# Patient Record
Sex: Female | Born: 1990 | Race: White | Hispanic: No | Marital: Single | Smoking: Never smoker
Health system: Southern US, Community
[De-identification: ages and names within clinical notes are randomized; demographics above are authoritative.]

## PROBLEM LIST (undated history)

## (undated) DIAGNOSIS — R7303 Prediabetes: Secondary | ICD-10-CM

## (undated) DIAGNOSIS — F99 Mental disorder, not otherwise specified: Secondary | ICD-10-CM

## (undated) HISTORY — DX: Mental disorder, not otherwise specified: F99

---
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
## 2012-07-13 ENCOUNTER — Emergency Department: Payer: Self-pay | Admitting: Emergency Medicine

## 2013-10-06 ENCOUNTER — Emergency Department: Payer: Self-pay | Admitting: Emergency Medicine

## 2017-10-07 ENCOUNTER — Other Ambulatory Visit: Payer: Self-pay

## 2017-10-07 ENCOUNTER — Encounter: Payer: Self-pay | Admitting: Emergency Medicine

## 2017-10-07 ENCOUNTER — Emergency Department: Payer: Self-pay

## 2017-10-07 ENCOUNTER — Emergency Department
Admission: EM | Admit: 2017-10-07 | Discharge: 2017-10-08 | Disposition: A | Discharge status: Home / Self Care | Payer: Self-pay | Attending: Emergency Medicine | Admitting: Emergency Medicine

## 2017-10-07 DIAGNOSIS — G8929 Other chronic pain: Secondary | ICD-10-CM | POA: Insufficient documentation

## 2017-10-07 DIAGNOSIS — M546 Pain in thoracic spine: Secondary | ICD-10-CM | POA: Insufficient documentation

## 2017-10-07 DIAGNOSIS — R112 Nausea with vomiting, unspecified: Secondary | ICD-10-CM | POA: Insufficient documentation

## 2017-10-07 DIAGNOSIS — F1210 Cannabis abuse, uncomplicated: Secondary | ICD-10-CM | POA: Insufficient documentation

## 2017-10-07 HISTORY — DX: Prediabetes: R73.03

## 2017-10-07 LAB — URINALYSIS, COMPLETE (UACMP) WITH MICROSCOPIC
Bacteria, UA: NONE SEEN
Bilirubin Urine: NEGATIVE
GLUCOSE, UA: NEGATIVE mg/dL
Hgb urine dipstick: NEGATIVE
Ketones, ur: 20 mg/dL — AB
Nitrite: NEGATIVE
PH: 8 (ref 5.0–8.0)
Protein, ur: 100 mg/dL — AB
SPECIFIC GRAVITY, URINE: 1.028 (ref 1.005–1.030)

## 2017-10-07 LAB — POCT PREGNANCY, URINE: PREG TEST UR: NEGATIVE

## 2017-10-07 MED ORDER — ONDANSETRON HCL 4 MG/2ML IJ SOLN
4.0000 mg | Freq: Once | INTRAMUSCULAR | Status: AC
  Administered 2017-10-07: 4 mg via INTRAVENOUS
  Filled 2017-10-07: qty 2

## 2017-10-07 MED ORDER — GI COCKTAIL ~~LOC~~
30.0000 mL | Freq: Once | ORAL | Status: AC
  Administered 2017-10-07: 30 mL via ORAL
  Filled 2017-10-07: qty 30

## 2017-10-07 MED ORDER — KETOROLAC TROMETHAMINE 30 MG/ML IJ SOLN
30.0000 mg | Freq: Once | INTRAMUSCULAR | Status: AC
  Administered 2017-10-07: 30 mg via INTRAVENOUS
  Filled 2017-10-07: qty 1

## 2017-10-07 MED ORDER — HALOPERIDOL LACTATE 5 MG/ML IJ SOLN
5.0000 mg | Freq: Once | INTRAMUSCULAR | Status: AC
  Administered 2017-10-07: 5 mg via INTRAVENOUS
  Filled 2017-10-07: qty 1

## 2017-10-07 NOTE — ED Notes (Signed)
Report given to Greg RN.

## 2017-10-07 NOTE — Discharge Instructions (Signed)
Please seek medical attention for any high fevers, chest pain, shortness of breath, change in behavior, persistent vomiting, bloody stool or any other new or concerning symptoms.  

## 2017-10-07 NOTE — ED Provider Notes (Signed)
[facility] Emergency Department Provider Note    ____________________________________________   I have reviewed the triage vital signs and the nursing notes.   HISTORY  Chief Complaint Back Pain   History limited by: Not Limited   HPI Kristina Avery is a 27 y.o. female who presents to the emergency department today because of concerns for acute on chronic back pain.  Is located primarily across her upper back.  Patient states she has been dealing with this for years.  She has been evaluated multiple emergency department states that she went to the spine center one time for.  She states that when the pain comes on she becomes tearful and develops nausea vomiting.  Vomiting makes it worse.    Per medical record review patient has a history of pre-diabetes.  Past Medical History:  Diagnosis Date  . Pre-diabetes     There are no active problems to display for this patient.   History reviewed. No pertinent surgical history.  Prior to Admission medications   Not on File    Allergies Depo-provera [medroxyprogesterone acetate]  No family history on file.  Social History Social History   Tobacco Use  . Smoking status: Never Smoker  . Smokeless tobacco: Never Used  Substance Use Topics  . Alcohol use: Yes    Comment: Occ  . Drug use: Yes    Types: Marijuana    Comment: Daily, only thing that helps with the pain    Review of Systems Constitutional: No fever/chills Eyes: No visual changes. ENT: No sore throat. Cardiovascular: Denies chest pain. Respiratory: Denies shortness of breath. Gastrointestinal: Positive for nausea and vomiting. Genitourinary: Negative for dysuria. Musculoskeletal: Positive for back pain. Skin: Negative for rash. Neurological: Negative for headaches, focal weakness or numbness.  ____________________________________________   PHYSICAL EXAM:  VITAL SIGNS: ED Triage Vitals  Enc Vitals Group     BP 10/07/17  1812 126/73     Pulse Rate 10/07/17 1812 (!) 52     Resp --      Temp 10/07/17 1812 97.7 F (36.5 C)     Temp Source 10/07/17 1812 Oral     SpO2 10/07/17 1812 100 %     Weight 10/07/17 1815 190 lb (86.2 kg)     Height 10/07/17 1815 4\' 8"  (1.422 m)     Head Circumference --      Peak Flow --      Pain Score 10/07/17 1820 10     Pain Loc --      Pain Edu? --      Excl. in GC? --    Constitutional: Alert and oriented.  Eyes: Conjunctivae are normal.  ENT      Head: Normocephalic and atraumatic.      Nose: No congestion/rhinnorhea.      Mouth/Throat: Mucous membranes are moist.      Neck: No stridor. Hematological/Lymphatic/Immunilogical: No cervical lymphadenopathy. Cardiovascular: Normal rate, regular rhythm.  No murmurs, rubs, or gallops.  Respiratory: Normal respiratory effort without tachypnea nor retractions. Breath sounds are clear and equal bilaterally. No wheezes/rales/rhonchi. Gastrointestinal: Soft and non tender. No rebound. No guarding.  Genitourinary: Deferred Musculoskeletal: Normal range of motion in all extremities. No midline spinal tenderness. Neurologic:  Normal speech and language. No gross focal neurologic deficits are appreciated.  Skin:  Skin is warm, dry and intact. No rash noted. Psychiatric: Mood and affect are normal. Speech and behavior are normal. Patient exhibits appropriate insight and judgment.  ____________________________________________    LABS (  pertinent positives/negatives)  Upreg negative UA cloudy, 20 ketones, 100 protein, trace leukocytes 6-10 rbc and wbc, 21-50 squamous  ____________________________________________   EKG  I, Phineas Semen, attending physician, personally viewed and interpreted this EKG  EKG Time: 1828 Rate: 57 Rhythm: sinus bradycardia Axis: normal Intervals: qtc 418 QRS: narrow ST changes: no st elevation Impression: abnormal ekg  ____________________________________________    RADIOLOGY  Thoracic  spine No acute finding  ____________________________________________   PROCEDURES  Procedures  ____________________________________________   INITIAL IMPRESSION / ASSESSMENT AND PLAN / ED COURSE  Pertinent labs & imaging results that were available during my care of the patient were reviewed by me and considered in my medical decision making (see chart for details).   Patient presented to the emergency department today with acute on chronic back pain.  She has seen orthopedic surgery for this.  Patient had x-rays performed here which did not show any concerning findings.  UA did show some signs of infection however also multiple squamous epithelial.  I discussed this with the patient.  Did discuss that we would send a urine culture.  At this point will defer antibiotics.  Did discuss with patient importance of continued follow-up with orthopedics.   ____________________________________________   FINAL CLINICAL IMPRESSION(S) / ED DIAGNOSES  Final diagnoses:  Chronic thoracic back pain, unspecified back pain laterality     Note: This dictation was prepared with Dragon dictation. Any transcriptional errors that result from this process are unintentional     Phineas Semen, MD 10/07/17 2302

## 2017-10-07 NOTE — ED Notes (Signed)
Patient transported to X-ray 

## 2017-10-07 NOTE — ED Triage Notes (Addendum)
Pt presents to ED via POV with c/o back pain. Pt states pain intermittently since high school. Pt upper back pain at this time. Pt denies heavy lifting at work. Pt states pain started on Friday and ibuprofen and icy hot lotion is no longer helping. Pt is tearful on assessment. Pt states pain is so bad that she has vomited and is unable to keep anything down.

## 2017-10-08 ENCOUNTER — Emergency Department
Admission: EM | Admit: 2017-10-08 | Discharge: 2017-10-08 | Disposition: A | Discharge status: Home / Self Care | Payer: Self-pay | Attending: Emergency Medicine | Admitting: Emergency Medicine

## 2017-10-08 ENCOUNTER — Encounter: Payer: Self-pay | Admitting: Emergency Medicine

## 2017-10-08 DIAGNOSIS — T434X1A Poisoning by butyrophenone and thiothixene neuroleptics, accidental (unintentional), initial encounter: Secondary | ICD-10-CM | POA: Insufficient documentation

## 2017-10-08 DIAGNOSIS — G2571 Drug induced akathisia: Secondary | ICD-10-CM | POA: Insufficient documentation

## 2017-10-08 MED ORDER — DIPHENHYDRAMINE HCL 25 MG PO CAPS
50.0000 mg | ORAL_CAPSULE | Freq: Once | ORAL | Status: AC
  Administered 2017-10-08: 50 mg via ORAL
  Filled 2017-10-08: qty 2

## 2017-10-08 MED ORDER — LIDOCAINE 5 % EX PTCH: 1.0000 | MEDICATED_PATCH | Freq: Two times a day (BID) | CUTANEOUS | 0 refills | Status: AC

## 2017-10-08 MED ORDER — DIPHENHYDRAMINE HCL 50 MG/ML IJ SOLN
50.0000 mg | Freq: Once | INTRAMUSCULAR | Status: AC
  Administered 2017-10-08: 50 mg via INTRAVENOUS
  Filled 2017-10-08: qty 1

## 2017-10-08 NOTE — Discharge Instructions (Signed)
Please take 50mg  of benadryl at 9pm tonight as needed.    Never take HALDOL, REGLAN, OR COMPAZINE again.  It was a pleasure to take care of you today, and thank you for coming to our emergency department.  If you have any questions or concerns before leaving please ask the nurse to grab me and I'm more than happy to go through your aftercare instructions again.  If you were prescribed any opioid pain medication today such as Norco, Vicodin, Percocet, morphine, hydrocodone, or oxycodone please make sure you do not drive when you are taking this medication as it can alter your ability to drive safely.  If you have any concerns once you are home that you are not improving or are in fact getting worse before you can make it to your follow-up appointment, please do not hesitate to call 911 and come back for further evaluation.  Merrily Brittle, MD  Results for orders placed or performed during the hospital encounter of 10/07/17  Urinalysis, Complete w Microscopic  Result Value Ref Range   Color, Urine AMBER (A) YELLOW   APPearance CLOUDY (A) CLEAR   Specific Gravity, Urine 1.028 1.005 - 1.030   pH 8.0 5.0 - 8.0   Glucose, UA NEGATIVE NEGATIVE mg/dL   Hgb urine dipstick NEGATIVE NEGATIVE   Bilirubin Urine NEGATIVE NEGATIVE   Ketones, ur 20 (A) NEGATIVE mg/dL   Protein, ur 416 (A) NEGATIVE mg/dL   Nitrite NEGATIVE NEGATIVE   Leukocytes, UA TRACE (A) NEGATIVE   RBC / HPF 6-10 0 - 5 RBC/hpf   WBC, UA 6-10 0 - 5 WBC/hpf   Bacteria, UA NONE SEEN NONE SEEN   Squamous Epithelial / LPF 21-50 0 - 5   Mucus PRESENT   Pregnancy, urine POC  Result Value Ref Range   Preg Test, Ur NEGATIVE NEGATIVE   Dg Thoracic Spine 2 View  Result Date: 10/07/2017 CLINICAL DATA:  Back pain EXAM: THORACIC SPINE 2 VIEWS COMPARISON:  None. FINDINGS: There is no evidence of thoracic spine fracture. Alignment is normal. No other significant bone abnormalities are identified. IMPRESSION: Negative. Electronically Signed    By: Jasmine Pang M.D.   On: 10/07/2017 22:47

## 2017-10-08 NOTE — ED Provider Notes (Signed)
[facility] Emergency Department Provider Note  ____________________________________________   First MD Initiated Contact with Patient 10/08/17 1423     (approximate)  I have reviewed the triage vital signs and the nursing notes.   HISTORY  Chief Complaint Anxiety and Medication Reaction   HPI Kristina Avery is a 27 y.o. female comes to the emergency department with anxiety, a sense of unease, and fidgeting that began yesterday shortly after being administered haloperidol in our emergency department.  She was seen last night for chronic back pain and administered Haldol which did help her back pain but nearly immediately gave her a sense of unease.  She went home and was unable to sleep feeling anxious and fidgeting so she came to the emergency department.  She is never had this before.  Symptoms came on gradually are now severe and constant.  Nothing seems to make them better and clearly Haldol made them worse.    Past Medical History:  Diagnosis Date  . Pre-diabetes     There are no active problems to display for this patient.   History reviewed. No pertinent surgical history.  Prior to Admission medications   Medication Sig Start Date End Date Taking? Authorizing Provider  acetaminophen (TYLENOL) 500 MG tablet Take 500-1,000 mg by mouth every 6 (six) hours as needed for mild pain or moderate pain.    [provider]  lidocaine (LIDODERM) 5 % Place 1 patch onto the skin every 12 (twelve) hours. Remove & Discard patch within 12 hours or as directed by MD.  Wynelle Fanny the patch off for 12 hours before applying a new one. 10/08/17 10/08/18  Loleta Rose, MD    Allergies Depo-provera [medroxyprogesterone acetate]  No family history on file.  Social History Social History   Tobacco Use  . Smoking status: Never Smoker  . Smokeless tobacco: Never Used  Substance Use Topics  . Alcohol use: Yes    Comment: Occ  . Drug use: Yes    Types:  Marijuana    Comment: Daily, only thing that helps with the pain    Review of Systems Constitutional: No fever/chills Eyes: No visual changes. ENT: No sore throat. Cardiovascular: Denies chest pain. Respiratory: Denies shortness of breath. Gastrointestinal: No abdominal pain.  No nausea, no vomiting.  No diarrhea.  No constipation. Genitourinary: Negative for dysuria. Musculoskeletal: Negative for back pain. Skin: Negative for rash. Neurological: Negative for headaches, focal weakness or numbness.   ____________________________________________   PHYSICAL EXAM:  VITAL SIGNS: ED Triage Vitals  Enc Vitals Group     BP 10/08/17 1415 (!) 182/148     Pulse Rate 10/08/17 1415 78     Resp 10/08/17 1415 (!) 28     Temp 10/08/17 1415 98.4 F (36.9 C)     Temp Source 10/08/17 1415 Oral     SpO2 10/08/17 1415 99 %     Weight 10/08/17 1418 190 lb (86.2 kg)     Height 10/08/17 1418 4\' 8"  (1.422 m)     Head Circumference --      Peak Flow --      Pain Score 10/08/17 1418 4     Pain Loc --      Pain Edu? --      Excl. in GC? --     Constitutional: Alert and oriented x4 sitting up in bed fidgeting wringing her hands appears extremely anxious Eyes: PERRL EOMI. Head: Atraumatic. Nose: No congestion/rhinnorhea. Mouth/Throat: No trismus Neck: No stridor.  Cardiovascular: Normal rate, regular rhythm. Grossly normal heart sounds.  Good peripheral circulation. Respiratory: Normal respiratory effort.  No retractions. Lungs CTAB and moving good air Gastrointestinal: Soft nontender Musculoskeletal: No lower extremity edema   Neurologic:  Normal speech and language. No gross focal neurologic deficits are appreciated. Skin:  Skin is warm, dry and intact. No rash noted. Psychiatric: Very anxious appearing   ____________________________________________   DIFFERENTIAL includes but not limited to  Akinesthesia, EPS, anxiety ____________________________________________   LABS (all  labs ordered are listed, but only abnormal results are displayed)  Labs Reviewed - No data to display   __________________________________________  EKG   ____________________________________________  RADIOLOGY   ____________________________________________   PROCEDURES  Procedure(s) performed: Yes  Angiocath insertion Performed by: Merrily Brittle  Consent: Verbal consent obtained. Risks and benefits: risks, benefits and alternatives were discussed Time out: Immediately prior to procedure a "time out" was called to verify the correct patient, procedure, equipment, support staff and site/side marked as required.  Preparation: Patient was prepped and draped in the usual sterile fashion.  Vein Location: Right upper extremity  Gauge: 22  Normal blood return and flush without difficulty Patient tolerance: Patient tolerated the procedure well with no immediate complications.     Procedures  Critical Care performed: no  ____________________________________________   INITIAL IMPRESSION / ASSESSMENT AND PLAN / ED COURSE  Pertinent labs & imaging results that were available during my care of the patient were reviewed by me and considered in my medical decision making (see chart for details).   As part of my medical decision making, I reviewed the following data within the electronic MEDICAL RECORD NUMBER History obtained from family if available, nursing notes, old chart and ekg, as well as notes from prior ED visits.  The patient arrives with symptoms consistent with drug-induced akinesthesia likely from dopamine blockade from haloperidol.  Given 50 mg of oral Benadryl prior to coming into the room and then we administered 50 mg IV more with near complete resolution of her symptoms.  Given reassurance and have advised her never to have haloperidol, Reglan, or Compazine in the future.  Discharged home in improved condition.       ____________________________________________   FINAL CLINICAL IMPRESSION(S) / ED DIAGNOSES  Final diagnoses:  Drug induced akathisia      NEW MEDICATIONS STARTED DURING THIS VISIT:  Discharge Medication List as of 10/08/2017  3:44 PM       Note:  This document was prepared using Dragon voice recognition software and may include unintentional dictation errors.     Merrily Brittle, MD 10/10/17 [id number]

## 2017-10-08 NOTE — ED Triage Notes (Signed)
Patient presents to the ED with anxiety and hyperventilating.  Patient's hands are shaking, patient is very fidgety.  Patient states she is feeling very anxious ever since she was given haldol in the ED yesterday evening for back pain.  Patient states at first the medication helped her pain and helped her to rest, but afterward she has been very anxious and feeling very hot.

## 2017-10-09 LAB — URINE CULTURE

## 2018-02-02 ENCOUNTER — Other Ambulatory Visit: Payer: Self-pay

## 2018-02-02 ENCOUNTER — Encounter: Payer: Self-pay | Admitting: Emergency Medicine

## 2018-02-02 ENCOUNTER — Emergency Department: Payer: Medicaid Other

## 2018-02-02 ENCOUNTER — Emergency Department
Admission: EM | Admit: 2018-02-02 | Discharge: 2018-02-02 | Disposition: A | Discharge status: Home / Self Care | Payer: Medicaid Other | Attending: Emergency Medicine | Admitting: Emergency Medicine

## 2018-02-02 DIAGNOSIS — O209 Hemorrhage in early pregnancy, unspecified: Secondary | ICD-10-CM | POA: Insufficient documentation

## 2018-02-02 DIAGNOSIS — Z79899 Other long term (current) drug therapy: Secondary | ICD-10-CM | POA: Insufficient documentation

## 2018-02-02 DIAGNOSIS — O4690 Antepartum hemorrhage, unspecified, unspecified trimester: Secondary | ICD-10-CM

## 2018-02-02 DIAGNOSIS — O26859 Spotting complicating pregnancy, unspecified trimester: Secondary | ICD-10-CM | POA: Diagnosis present

## 2018-02-02 LAB — HCG, QUANTITATIVE, PREGNANCY: hCG, Beta Chain, Quant, S: 587 m[IU]/mL — ABNORMAL HIGH (ref <5)

## 2018-02-02 LAB — ABO/RH: ABO/RH(D): O POS

## 2018-02-02 NOTE — ED Notes (Signed)
Pt verbalizes d/c understanding and follow up. Pt in NAD, VSS. Pt unable to sign due to signature pad malfnx

## 2018-02-02 NOTE — ED Triage Notes (Signed)
Pt to ED via POV c/o vaginal bleeding. Pt states that she had a positive pregnancy test on 12/25. Pt was at work and went to the bathroom and noticed that she was bleeding. Pt states that it is heavier than spotting but not period flow. Pt states that she is having mild abdominal cramps. Pt is in NAD at this time.

## 2018-02-02 NOTE — ED Provider Notes (Signed)
[facility] Emergency Department Provider Note   ____________________________________________    I have reviewed the triage vital signs and the nursing notes.   HISTORY  Chief Complaint Vaginal Bleeding     HPI Kristina Avery is a 27 y.o. female who recently discovered that she was pregnant who presents today after having episode of vaginal bleeding.  Patient reports he went to urinate and noticed small mount of blood slightly more than spotting.  This is improved and now she is no longer having bleeding.  She denies abdominal cramping or pain.  No fevers or chills.  G2 P0.  Past Medical History:  Diagnosis Date  . Pre-diabetes     There are no active problems to display for this patient.   History reviewed. No pertinent surgical history.  Prior to Admission medications   Medication Sig Start Date End Date Taking? Authorizing Provider  acetaminophen (TYLENOL) 500 MG tablet Take 500-1,000 mg by mouth every 6 (six) hours as needed for mild pain or moderate pain.    [provider]  lidocaine (LIDODERM) 5 % Place 1 patch onto the skin every 12 (twelve) hours. Remove & Discard patch within 12 hours or as directed by MD.  Wynelle FannyLeave the patch off for 12 hours before applying a new one. 10/08/17 10/08/18  Loleta RoseForbach, Cory, MD     Allergies Haldol [haloperidol] and Depo-provera [medroxyprogesterone acetate]  No family history on file.  Social History Social History   Tobacco Use  . Smoking status: Never Smoker  . Smokeless tobacco: Never Used  Substance Use Topics  . Alcohol use: Yes    Comment: Occ  . Drug use: Yes    Types: Marijuana    Comment: Daily, only thing that helps with the pain    Review of Systems  Constitutional: No fever/chills   Cardiovascular: Denies chest pain. Respiratory: Denies shortness of breath. Gastrointestinal: As above Genitourinary: Negative for dysuria.  Bleeding as above Musculoskeletal: Negative for  back pain. Skin: Negative for rash. Neurological: Negative for headaches    ____________________________________________   PHYSICAL EXAM:  VITAL SIGNS: ED Triage Vitals  Enc Vitals Group     BP 02/02/18 1601 119/84     Pulse Rate 02/02/18 1601 83     Resp 02/02/18 1601 16     Temp 02/02/18 1601 98.8 F (37.1 C)     Temp Source 02/02/18 1601 Oral     SpO2 02/02/18 1601 99 %     Weight 02/02/18 1601 81.6 kg (180 lb)     Height 02/02/18 1601 1.422 m (4\' 8" )     Head Circumference --      Peak Flow --      Pain Score 02/02/18 1604 2     Pain Loc --      Pain Edu? --      Excl. in GC? --     Constitutional: Alert and oriented.  Eyes: Conjunctivae are normal.   Nose: No congestion/rhinnorhea. Mouth/Throat: Mucous membranes are moist.    Cardiovascular: Normal rate, regular rhythm.   Good peripheral circulation. Respiratory: Normal respiratory effort.  No retractions.  Gastrointestinal: Soft and nontender. No distention.    Musculoskeletal:  Warm and well perfused Neurologic:  Normal speech and language. No gross focal neurologic deficits are appreciated.  Skin:  Skin is warm, dry and intact. No rash noted. Psychiatric: Mood and affect are normal. Speech and behavior are normal.  ____________________________________________   LABS (all labs ordered are listed, but  only abnormal results are displayed)  Labs Reviewed  HCG, QUANTITATIVE, PREGNANCY - Abnormal; Notable for the following components:      Result Value   hCG, Beta Chain, Quant, S 587 (*)    All other components within normal limits  ABO/RH   ____________________________________________  EKG  None ____________________________________________  RADIOLOGY Ultrasound pending ____________________________________________   PROCEDURES  Procedure(s) performed: No  Procedures   Critical Care performed: No ____________________________________________   INITIAL IMPRESSION / ASSESSMENT AND PLAN /  ED COURSE  Pertinent labs & imaging results that were available during my care of the patient were reviewed by me and considered in my medical decision making (see chart for details).  Patient reports that she thinks she is approximately 6 to [redacted] weeks pregnant, vaginal bleeding today now resolved.  Pending ultrasound.  Patient is Rh+  Ultrasound does not demonstrate any visible gestational sac, this may be because she is too early.  Discussed with her that she needs a repeat evaluation/ultrasound.  She has no abdominal pain or cramping    ____________________________________________   FINAL CLINICAL IMPRESSION(S) / ED DIAGNOSES  Final diagnoses:  Vaginal bleeding in pregnancy        Note:  This document was prepared using Dragon voice recognition software and may include unintentional dictation errors.    Jene EveryKinner, Amari Zagal, MD 02/02/18 2122

## 2018-02-04 ENCOUNTER — Encounter: Payer: Self-pay | Admitting: Emergency Medicine

## 2018-02-04 ENCOUNTER — Emergency Department: Payer: Medicaid Other

## 2018-02-04 ENCOUNTER — Other Ambulatory Visit: Payer: Self-pay

## 2018-02-04 ENCOUNTER — Emergency Department
Admission: EM | Admit: 2018-02-04 | Discharge: 2018-02-04 | Disposition: A | Discharge status: Home / Self Care | Payer: Medicaid Other | Attending: Emergency Medicine | Admitting: Emergency Medicine

## 2018-02-04 DIAGNOSIS — Z3A00 Weeks of gestation of pregnancy not specified: Secondary | ICD-10-CM | POA: Diagnosis not present

## 2018-02-04 DIAGNOSIS — O209 Hemorrhage in early pregnancy, unspecified: Secondary | ICD-10-CM | POA: Diagnosis present

## 2018-02-04 DIAGNOSIS — O4690 Antepartum hemorrhage, unspecified, unspecified trimester: Secondary | ICD-10-CM

## 2018-02-04 DIAGNOSIS — O200 Threatened abortion: Secondary | ICD-10-CM | POA: Diagnosis not present

## 2018-02-04 LAB — CBC
HCT: 39.3 % (ref 36.0–46.0)
Hemoglobin: 12.8 g/dL (ref 12.0–15.0)
MCH: 27.9 pg (ref 26.0–34.0)
MCHC: 32.6 g/dL (ref 30.0–36.0)
MCV: 85.6 fL (ref 80.0–100.0)
NRBC: 0 % (ref 0.0–0.2)
Platelets: 267 10*3/uL (ref 150–400)
RBC: 4.59 MIL/uL (ref 3.87–5.11)
RDW: 14.9 % (ref 11.5–15.5)
WBC: 6 10*3/uL (ref 4.0–10.5)

## 2018-02-04 LAB — TYPE AND SCREEN
ABO/RH(D): O POS
ANTIBODY SCREEN: NEGATIVE

## 2018-02-04 LAB — I-STAT BETA HCG BLOOD, ED (NOT ORDERABLE): I-stat hCG, quantitative: 895.6 m[IU]/mL — ABNORMAL HIGH (ref <5)

## 2018-02-04 NOTE — ED Triage Notes (Signed)
Pt seen here 2 days ago. ABO done then. C/o increased vaginal bleeding and cramping to lower abdomen.  Ambulatory with steady gait. NAD. Unlabored.  Will repeat labs and US while waiting for room.

## 2018-02-04 NOTE — ED Provider Notes (Signed)
[facility] Emergency Department Provider Note   ____________________________________________    I have reviewed the triage vital signs and the nursing notes.   HISTORY  Chief Complaint Vaginal Bleeding     HPI Kristina Avery is a 27 y.o. female who presents with complaints of lower abdominal cramping and vaginal bleeding, slightly worse than yesterday.  She became anxious regarding this and came for reevaluation.  Patient seen by me yesterday with ultrasound and beta as noted in the medical record.  Currently she is feeling better and does not have any significant pelvic cramping, she reports bleeding has improved.   Past Medical History:  Diagnosis Date  . Pre-diabetes     There are no active problems to display for this patient.   History reviewed. No pertinent surgical history.  Prior to Admission medications   Medication Sig Start Date End Date Taking? Authorizing Provider  acetaminophen (TYLENOL) 500 MG tablet Take 500-1,000 mg by mouth every 6 (six) hours as needed for mild pain or moderate pain.    [provider]  lidocaine (LIDODERM) 5 % Place 1 patch onto the skin every 12 (twelve) hours. Remove & Discard patch within 12 hours or as directed by MD.  Wynelle FannyLeave the patch off for 12 hours before applying a new one. 10/08/17 10/08/18  Loleta RoseForbach, Cory, MD     Allergies Haldol [haloperidol] and Depo-provera [medroxyprogesterone acetate]  History reviewed. No pertinent family history.  Social History Social History   Tobacco Use  . Smoking status: Never Smoker  . Smokeless tobacco: Never Used  Substance Use Topics  . Alcohol use: Yes    Comment: Occ  . Drug use: Yes    Types: Marijuana    Comment: Daily, only thing that helps with the pain    Review of Systems  Constitutional: No fever Eyes: No visual changes.  ENT: No neck pain Cardiovascular: Denies chest pain. Respiratory: Denies cough Gastrointestinal: No abdominal  pain.  No nausea, no vomiting.   Genitourinary: as above Musculoskeletal: Negative for back pain. Skin: Negative for rash. Neurological: Negative for headaches    ____________________________________________   PHYSICAL EXAM:  VITAL SIGNS: ED Triage Vitals  Enc Vitals Group     BP 02/04/18 1216 100/71     Pulse Rate 02/04/18 1216 71     Resp 02/04/18 1216 14     Temp 02/04/18 1216 98.4 F (36.9 C)     Temp Source 02/04/18 1216 Oral     SpO2 02/04/18 1216 99 %     Weight 02/04/18 1131 81.6 kg (179 lb 14.3 oz)     Height 02/04/18 1131 1.422 m (4\' 8" )     Head Circumference --      Peak Flow --      Pain Score 02/04/18 1131 4     Pain Loc --      Pain Edu? --      Excl. in GC? --     Constitutional: Alert and oriented. anxiou  Nose: No congestion/rhinnorhea. Mouth/Throat: Mucous membranes are moist.    Cardiovascular: Normal rate, regular rhythm. Peri [place].  Good peripheral circulation. Respiratory: Normal respiratory effort.  No retractions. Gastrointestinal: Soft and nontender. No distention.  No CVA tenderness.  Neurologic:  Normal speech and language. No gross focal neurologic deficits are appreciated.  Skin:  Skin is warm, dry and intact. No rash noted. Psychiatric: Mood and affect are normal. Speech and behavior are normal.  ____________________________________________   LABS (all labs ordered are  listed, but only abnormal results are displayed)  Labs Reviewed  I-STAT BETA HCG BLOOD, ED (NOT ORDERABLE) - Abnormal; Notable for the following components:      Result Value   I-stat hCG, quantitative 895.6 (*)    All other components within normal limits  CBC  TYPE AND SCREEN   ____________________________________________  EKG   ____________________________________________  RADIOLOGY  Ultrasound unchanged from yesterday ____________________________________________   PROCEDURES  Procedure(s) performed: No  Procedures   Critical Care performed:  No ____________________________________________   INITIAL IMPRESSION / ASSESSMENT AND PLAN / ED COURSE  Pertinent labs & imaging results that were available during my care of the patient were reviewed by me and considered in my medical decision making (see chart for details).  Informed patient that beta-hCG has increased which is a positive sign but miscarriage is still a possibility, she needs follow-up with GYN.  Patient is O+    ____________________________________________   FINAL CLINICAL IMPRESSION(S) / ED DIAGNOSES  Final diagnoses:  Threatened miscarriage  Vaginal bleeding in pregnancy        Note:  This document was prepared using Dragon voice recognition software and may include unintentional dictation errors.   Jene EveryKinner, Lenin Kuhnle, MD 02/04/18 2132

## 2018-02-14 ENCOUNTER — Other Ambulatory Visit (HOSPITAL_COMMUNITY)
Admission: RE | Admit: 2018-02-14 | Discharge: 2018-02-14 | Disposition: A | Discharge status: Home / Self Care | Payer: Medicaid Other | Source: Ambulatory Visit | Attending: Obstetrics and Gynecology | Admitting: Obstetrics and Gynecology

## 2018-02-14 ENCOUNTER — Encounter: Payer: Self-pay | Admitting: Obstetrics and Gynecology

## 2018-02-14 ENCOUNTER — Ambulatory Visit (INDEPENDENT_AMBULATORY_CARE_PROVIDER_SITE_OTHER): Payer: Medicaid Other | Admitting: Obstetrics and Gynecology

## 2018-02-14 VITALS — BP 104/68 | HR 73 | Ht <= 58 in | Wt 180.0 lb

## 2018-02-14 DIAGNOSIS — O200 Threatened abortion: Secondary | ICD-10-CM

## 2018-02-14 DIAGNOSIS — O3680X0 Pregnancy with inconclusive fetal viability, not applicable or unspecified: Secondary | ICD-10-CM | POA: Diagnosis not present

## 2018-02-14 DIAGNOSIS — Z124 Encounter for screening for malignant neoplasm of cervix: Secondary | ICD-10-CM | POA: Insufficient documentation

## 2018-02-14 NOTE — Progress Notes (Signed)
Patient ID: Kristina Avery, female   DOB: May 13, 1990, 28 y.o.   MRN: [id number]  Reason for Consult: Follow-up (Still having heavy bleeding sense last sunday, small clots passing, light cramping )   Referred by No ref. provider found  Subjective:     HPI:  Kristina Avery is a 28 y.o. female   Positive pregnancy test on Christmas. Does not know her LMP. It may of been November. Does not have regular menstrual periods. Has been trying to concieve for 8 years.  Since the 29 th has been having bleeding, two days of heavier bleeding.   Past Medical History:  Diagnosis Date  . Pre-diabetes    Family History  Problem Relation Age of Onset  . Diabetes Father   . Diabetes Brother   . Diabetes Maternal Grandmother   . COPD Maternal Grandmother   . Diabetes Maternal Grandfather   . Diabetes Paternal Grandmother   . Diabetes Paternal Grandfather   . COPD Paternal Grandfather    History reviewed. No pertinent surgical history.  Short Social History:  Social History   Tobacco Use  . Smoking status: Never Smoker  . Smokeless tobacco: Never Used  Substance Use Topics  . Alcohol use: Yes    Comment: Occ    Allergies  Allergen Reactions  . Haldol [Haloperidol] Other (See Comments)    Akathisia  . Depo-Provera [Medroxyprogesterone Acetate] Other (See Comments)    "got really sick, my eyes turned yellow".    Current Outpatient Medications  Medication Sig Dispense Refill  . acetaminophen (TYLENOL) 500 MG tablet Take 500-1,000 mg by mouth every 6 (six) hours as needed for mild pain or moderate pain.    Marland Kitchen lidocaine (LIDODERM) 5 % Place 1 patch onto the skin every 12 (twelve) hours. Remove & Discard patch within 12 hours or as directed by MD.  Wynelle Fanny the patch off for 12 hours before applying a new one. (Patient not taking: Reported on 02/14/2018) 10 patch 0   No current facility-administered medications for this visit.     Review of Systems  Constitutional: Negative for  chills, fatigue, fever and unexpected weight change.  HENT: Negative for trouble swallowing.  Eyes: Negative for loss of vision.  Respiratory: Negative for cough, shortness of breath and wheezing.  Cardiovascular: Negative for chest pain, leg swelling, palpitations and syncope.  GI: Negative for abdominal pain, blood in stool, diarrhea, nausea and vomiting.  GU: Negative for difficulty urinating, dysuria, frequency and hematuria.  Musculoskeletal: Negative for back pain, leg pain and joint pain.  Skin: Negative for rash.  Neurological: Negative for dizziness, headaches, light-headedness, numbness and seizures.  Psychiatric: Negative for behavioral problem, confusion, depressed mood and sleep disturbance.        Objective:  Objective   Vitals:   02/14/18 1327  BP: 104/68  Pulse: 73  Weight: 180 lb (81.6 kg)  Height: 4\' 8"  (1.422 m)   Body mass index is 40.36 kg/m.  Physical Exam Vitals signs and nursing note reviewed.  Constitutional:      Appearance: She is well-developed.  HENT:     Head: Normocephalic and atraumatic.  Eyes:     Pupils: Pupils are equal, round, and reactive to light.  Cardiovascular:     Rate and Rhythm: Normal rate and regular rhythm.  Pulmonary:     Effort: Pulmonary effort is normal. No respiratory distress.  Genitourinary:    Comments: No active vaginal bleeding on speculum exam Skin:    General: Skin is warm  and dry.  Neurological:     Mental Status: She is alert and oriented to person, place, and time.  Psychiatric:        Behavior: Behavior normal.        Thought Content: Thought content normal.        Judgment: Judgment normal.    Bedside US TVUS showed IUP with yolk sac and gestational sac     Assessment/Plan:    27yo G2P0010 with threatened miscarriage Bedside transvaginal US performed, yolk sac and gestational sac seen. Photo in media file.  Follow up in 1 week Beta hcg today  Bleeding precautions given, follow up if there is  heavier bleeding  More than 45 minutes were spent face to face with the patient in the room with more than 50% of the time spent providing counseling and discussing the plan of management. We discussed.   Adelene Idlerhristanna Schuman MD [facility], [facility] 02/14/2018 2:12 PM

## 2018-02-15 LAB — BETA HCG QUANT (REF LAB): HCG QUANT: 9873 m[IU]/mL

## 2018-02-17 ENCOUNTER — Encounter: Payer: Self-pay | Admitting: Obstetrics & Gynecology

## 2018-02-17 ENCOUNTER — Other Ambulatory Visit: Payer: Self-pay | Admitting: Obstetrics & Gynecology

## 2018-02-17 ENCOUNTER — Ambulatory Visit (INDEPENDENT_AMBULATORY_CARE_PROVIDER_SITE_OTHER): Payer: Medicaid Other | Admitting: Obstetrics & Gynecology

## 2018-02-17 ENCOUNTER — Ambulatory Visit (INDEPENDENT_AMBULATORY_CARE_PROVIDER_SITE_OTHER): Payer: Medicaid Other

## 2018-02-17 VITALS — BP 100/60 | Ht <= 58 in | Wt 183.0 lb

## 2018-02-17 DIAGNOSIS — Z3A01 Less than 8 weeks gestation of pregnancy: Secondary | ICD-10-CM | POA: Diagnosis not present

## 2018-02-17 DIAGNOSIS — O3680X0 Pregnancy with inconclusive fetal viability, not applicable or unspecified: Secondary | ICD-10-CM

## 2018-02-17 DIAGNOSIS — O209 Hemorrhage in early pregnancy, unspecified: Secondary | ICD-10-CM

## 2018-02-17 DIAGNOSIS — O200 Threatened abortion: Secondary | ICD-10-CM | POA: Diagnosis not present

## 2018-02-17 NOTE — Progress Notes (Signed)
Obstetric Problem Visit    Chief Complaint  Patient presents with  . Follow-up  . Vaginal Bleeding   History of Present Illness: Patient is a 28 y.o. G2P0010 Unknown presenting for first trimester bleeding.  The onset of bleeding was for 2 weeks.  No pain.  Still bleeding today. Min nausea.  Mild breast T.  She has seen beta rise from 900 to 9800 US shows gest sac, may be in cornua vs having an abnormal uterus (right sided gest sac).  Flicker of FHTs.  O+  PMHx: She  has a past medical history of Pre-diabetes. Also,  has no past surgical history on file., family history includes COPD in her maternal grandmother and paternal grandfather; Diabetes in her brother, father, maternal grandfather, maternal grandmother, paternal grandfather, and paternal grandmother.,  reports that she has never smoked. She has never used smokeless tobacco. She reports current alcohol use. She reports current drug use. Drug: Marijuana.  She has a current medication list which includes the following prescription(s): acetaminophen and lidocaine. Also, is allergic to haldol [haloperidol] and depo-provera [medroxyprogesterone acetate].  Review of Systems  Constitutional: Negative for chills, fever and malaise/fatigue.  HENT: Negative for congestion, sinus pain and sore throat.   Eyes: Negative for blurred vision and pain.  Respiratory: Negative for cough and wheezing.   Cardiovascular: Negative for chest pain and leg swelling.  Gastrointestinal: Negative for abdominal pain, constipation, diarrhea, heartburn, nausea and vomiting.  Genitourinary: Negative for dysuria, frequency, hematuria and urgency.  Musculoskeletal: Negative for back pain, joint pain, myalgias and neck pain.  Skin: Negative for itching and rash.  Neurological: Negative for dizziness, tremors and weakness.  Endo/Heme/Allergies: Does not bruise/bleed easily.  Psychiatric/Behavioral: Negative for depression. The patient is not nervous/anxious and  does not have insomnia.     Objective: Vitals:   02/17/18 1420  BP: 100/60   Physical Exam Constitutional:      General: She is not in acute distress.    Appearance: She is well-developed.  Musculoskeletal: Normal range of motion.  Neurological:     Mental Status: She is alert and oriented to person, place, and time.  Skin:    General: Skin is warm and dry.  Vitals signs reviewed.   Assessment: 28 y.o. G2P0010 Bleeding, pregnancy location abnormal, 5 weeks Plan:   Visit Diagnoses    Pregnancy, location unknown    -  Primary   Relevant Orders   US OB Transvaginal    Cont to monitor bleeding and for pain - [place] one week - Risks of Cornual pregnancy discussed, vs arcuate uterus, bicornuate uterus, septate uterus       All have impact on fertility, miscarriage, pregnancy       Cornual ectopic carries highest risk, counseled on s/sx to watch for  1) First trimester bleeding - incidence and clinical course of first trimester bleeding is discussed in detail with the patient today.  Approximately 1/3 of pregnancies ending in live births experienced 1st trimester bleeding.  The amount of bleeding is variable and not necessarily predictive of outcome.  Sources may be cervical or uterine.  Subchorionic hemorrhages are a frequent concurrent findings on ultrasound and are followed expectantly.  These often absorb or regress spontaneously although risk for expansion and further disruption of the utero-placental interface leading to miscarriage is possible.  There is no clearly documented benefit to limiting or modifying activity and sexual intercourse in altering clinic course of 1st trimester bleeding.    2) Routine bleeding precautions were discussed  with the patient prior the conclusion of today's visit.  Annamarie Major, MD, [facility], [facility] 02/17/2018  2:43 PM

## 2018-02-19 ENCOUNTER — Encounter: Payer: Self-pay | Admitting: Emergency Medicine

## 2018-02-19 ENCOUNTER — Emergency Department: Payer: Medicaid Other

## 2018-02-19 ENCOUNTER — Other Ambulatory Visit: Payer: Self-pay

## 2018-02-19 ENCOUNTER — Emergency Department
Admission: EM | Admit: 2018-02-19 | Discharge: 2018-02-19 | Disposition: A | Discharge status: Home / Self Care | Payer: Medicaid Other | Attending: Emergency Medicine | Admitting: Emergency Medicine

## 2018-02-19 DIAGNOSIS — Z79899 Other long term (current) drug therapy: Secondary | ICD-10-CM | POA: Diagnosis not present

## 2018-02-19 DIAGNOSIS — R1030 Lower abdominal pain, unspecified: Secondary | ICD-10-CM | POA: Insufficient documentation

## 2018-02-19 DIAGNOSIS — O9989 Other specified diseases and conditions complicating pregnancy, childbirth and the puerperium: Secondary | ICD-10-CM | POA: Diagnosis present

## 2018-02-19 DIAGNOSIS — R102 Pelvic and perineal pain: Secondary | ICD-10-CM | POA: Insufficient documentation

## 2018-02-19 DIAGNOSIS — Z3A01 Less than 8 weeks gestation of pregnancy: Secondary | ICD-10-CM | POA: Diagnosis not present

## 2018-02-19 DIAGNOSIS — O200 Threatened abortion: Secondary | ICD-10-CM | POA: Insufficient documentation

## 2018-02-19 LAB — COMPREHENSIVE METABOLIC PANEL
ALT: 14 U/L (ref 0–44)
AST: 17 U/L (ref 15–41)
Albumin: 3.7 g/dL (ref 3.5–5.0)
Alkaline Phosphatase: 61 U/L (ref 38–126)
Anion gap: 11 (ref 5–15)
BUN: 9 mg/dL (ref 6–20)
CHLORIDE: 105 mmol/L (ref 98–111)
CO2: 20 mmol/L — ABNORMAL LOW (ref 22–32)
Calcium: 8.8 mg/dL — ABNORMAL LOW (ref 8.9–10.3)
Creatinine, Ser: 0.57 mg/dL (ref 0.44–1.00)
GFR calc Af Amer: >60 mL/min (ref >60)
GFR calc non Af Amer: >60 mL/min (ref >60)
Glucose, Bld: 97 mg/dL (ref 70–99)
Potassium: 3.8 mmol/L (ref 3.5–5.1)
Sodium: 136 mmol/L (ref 135–145)
Total Bilirubin: 0.3 mg/dL (ref 0.3–1.2)
Total Protein: 7.1 g/dL (ref 6.5–8.1)

## 2018-02-19 LAB — CBC
HCT: 39 % (ref 36.0–46.0)
Hemoglobin: 12.7 g/dL (ref 12.0–15.0)
MCH: 28 pg (ref 26.0–34.0)
MCHC: 32.6 g/dL (ref 30.0–36.0)
MCV: 85.9 fL (ref 80.0–100.0)
PLATELETS: 202 10*3/uL (ref 150–400)
RBC: 4.54 MIL/uL (ref 3.87–5.11)
RDW: 14.9 % (ref 11.5–15.5)
WBC: 7.4 10*3/uL (ref 4.0–10.5)
nRBC: 0 % (ref 0.0–0.2)

## 2018-02-19 LAB — CYTOLOGY - PAP: Diagnosis: NEGATIVE

## 2018-02-19 LAB — HCG, QUANTITATIVE, PREGNANCY: hCG, Beta Chain, Quant, S: 12069 m[IU]/mL — ABNORMAL HIGH (ref <5)

## 2018-02-19 MED ORDER — OXYCODONE HCL 5 MG PO TABS
5.0000 mg | ORAL_TABLET | Freq: Once | ORAL | Status: AC
  Administered 2018-02-19: 5 mg via ORAL
  Filled 2018-02-19: qty 1

## 2018-02-19 MED ORDER — HYDROCODONE-ACETAMINOPHEN 5-325 MG PO TABS: 1.0000 | ORAL_TABLET | ORAL | 0 refills | Status: DC | PRN

## 2018-02-19 MED ORDER — ACETAMINOPHEN 500 MG PO TABS
1000.0000 mg | ORAL_TABLET | Freq: Once | ORAL | Status: AC
  Administered 2018-02-19: 1000 mg via ORAL
  Filled 2018-02-19: qty 2

## 2018-02-19 NOTE — ED Triage Notes (Signed)
Pt comes via POV from home with c/o vaginal bleeding and abdominal pain. Pt states this has been ongoing for 2 weeks. Pt states she is [redacted] weeks along.  Pt states severe pain. Pt states heavy bleeding with clots.

## 2018-02-19 NOTE — ED Provider Notes (Signed)
[facility] Emergency Department Provider Note  Time seen: 6:09 PM  I have reviewed the triage vital signs and the nursing notes.   HISTORY  Chief Complaint Abdominal Pain and Vaginal Bleeding    HPI Kristina Avery is a 28 y.o. female G2 A1 approximately 6 to [redacted] weeks pregnant presents to the emergency department for abdominal cramping vaginal bleeding.  According to the patient she found out she was pregnant 12/25.  Starting 12/29 she developed mild vaginal bleeding and lower abdominal cramping.  Has been seen twice in the emergency department as well as twice by [facility] for the same.  Patient states today the bleeding worsened along with some clot as well as lower abdominal cramping worsened.  Past Medical History:  Diagnosis Date  . Pre-diabetes     Patient Active Problem List   Diagnosis Date Noted  . First trimester bleeding 02/17/2018    History reviewed. No pertinent surgical history.  Prior to Admission medications   Medication Sig Start Date End Date Taking? Authorizing Provider  acetaminophen (TYLENOL) 500 MG tablet Take 500-1,000 mg by mouth every 6 (six) hours as needed for mild pain or moderate pain.    [provider]  lidocaine (LIDODERM) 5 % Place 1 patch onto the skin every 12 (twelve) hours. Remove & Discard patch within 12 hours or as directed by MD.  Wynelle Fanny the patch off for 12 hours before applying a new one. 10/08/17 10/08/18  Loleta Rose, MD    Allergies  Allergen Reactions  . Haldol [Haloperidol] Other (See Comments)    Akathisia  . Depo-Provera [Medroxyprogesterone Acetate] Other (See Comments)    "got really sick, my eyes turned yellow".    Family History  Problem Relation Age of Onset  . Diabetes Father   . Diabetes Brother   . Diabetes Maternal Grandmother   . COPD Maternal Grandmother   . Diabetes Maternal Grandfather   . Diabetes Paternal Grandmother   . Diabetes Paternal Grandfather   . COPD  Paternal Grandfather     Social History Social History   Tobacco Use  . Smoking status: Never Smoker  . Smokeless tobacco: Never Used  Substance Use Topics  . Alcohol use: Yes    Comment: Occ  . Drug use: Yes    Types: Marijuana    Comment: Daily, only thing that helps with the pain    Review of Systems Constitutional: Negative for fever Cardiovascular: Negative for chest pain. Respiratory: Negative for shortness of breath. Gastrointestinal: Moderate lower abdominal pain described as cramping.  Negative for nausea vomiting or diarrhea. Genitourinary: Positive for vaginal bleeding, ongoing x2 weeks but worse today. Musculoskeletal: Negative for musculoskeletal complaints Skin: Negative for skin complaints  Neurological: Negative for headache All other ROS negative  ____________________________________________   PHYSICAL EXAM:  Constitutional: Alert and oriented.  Mild apparent discomfort due to lower abdominal discomfort. Eyes: Normal exam ENT   Head: Normocephalic and atraumatic.   Mouth/Throat: Mucous membranes are moist. Cardiovascular: Normal rate, regular rhythm. No murmur Respiratory: Normal respiratory effort without tachypnea nor retractions. Breath sounds are clear  Gastrointestinal: Soft, mild suprapubic tenderness palpation.  No rebound guarding or distention. Musculoskeletal: Nontender with normal range of motion in all extremities. Neurologic:  Normal speech and language. No gross focal neurologic deficits  Skin:  Skin is warm, dry and intact.  Psychiatric: Mood and affect are normal.   ____________________________________________   RADIOLOGY  1. No gestational sac is visualized. The endometrium is thickened, and irregular  with peripheral cystic spaces suggesting sloughing of the endometrial lining. Findings are suspicious but not yet definitive for failed pregnancy. Recommend follow-up US in 10-14 days for definitive diagnosis. This  recommendation follows SRU consensus guidelines: Diagnostic Criteria for Nonviable Pregnancy Early in the First Trimester. Malva Limes Engl J Med 2013; 191:4782-95; 369:1443-51. 2. The left ovary could not be visualized. 3. Simple follicular cyst noted incidentally in the right ovary.  ____________________________________________   INITIAL IMPRESSION / ASSESSMENT AND PLAN / ED COURSE  Pertinent labs & imaging results that were available during my care of the patient were reviewed by me and considered in my medical decision making (see chart for details).  Patient presents with increased vaginal bleeding lower abdominal cramping which has been an ongoing issue over the past 16 days but worse today.  We will obtain repeat lab work to check quantitative beta hCG level.  We will obtain a repeat ultrasound to further evaluate.  Patient agreeable to plan of care.  We will dose Tylenol for discomfort while awaiting results.  Beta-hCG is slightly higher however ultrasound shows likely failed pregnancy, patient will follow-up with OB/GYN for repeat ultrasound in 10 to 14 days.  Patient agreeable to plan of care.  ____________________________________________   FINAL CLINICAL IMPRESSION(S) / ED DIAGNOSES  Lower abdominal discomfort Threatened miscarriage    Minna AntisPaduchowski, Bryleigh Ottaway, MD 02/19/18 2006

## 2018-02-19 NOTE — ED Triage Notes (Signed)
FIRST NURSE NOTE-here for abdominal pain. Pt thinks having miscarriage. + vaginal bleeding. [redacted] weeks pregnant. Hx of same

## 2018-02-19 NOTE — ED Notes (Signed)
Pt is approx [redacted] weeks pregnant and has been experiencing cramping, clots and abd pain x 2 days

## 2018-02-19 NOTE — Discharge Instructions (Addendum)
Please follow-up with OB/GYN in approximately 7 to 10 days for recheck/reevaluation.  Return to the emergency department for any significant increase in pain, significant increase in bleeding, or any other symptom personally concerning to yourself.

## 2018-02-24 ENCOUNTER — Other Ambulatory Visit: Payer: Medicaid Other

## 2018-02-24 ENCOUNTER — Ambulatory Visit: Payer: Medicaid Other | Admitting: Obstetrics and Gynecology

## 2018-02-26 ENCOUNTER — Encounter: Payer: Medicaid Other | Admitting: Obstetrics & Gynecology

## 2018-02-26 ENCOUNTER — Ambulatory Visit (INDEPENDENT_AMBULATORY_CARE_PROVIDER_SITE_OTHER): Payer: Medicaid Other

## 2018-02-26 ENCOUNTER — Encounter: Payer: Self-pay | Admitting: Obstetrics & Gynecology

## 2018-02-26 ENCOUNTER — Ambulatory Visit (INDEPENDENT_AMBULATORY_CARE_PROVIDER_SITE_OTHER): Payer: Medicaid Other | Admitting: Obstetrics & Gynecology

## 2018-02-26 VITALS — BP 100/70 | Wt 178.0 lb

## 2018-02-26 VITALS — BP 100/70 | Ht <= 58 in | Wt 178.0 lb

## 2018-02-26 DIAGNOSIS — O3680X0 Pregnancy with inconclusive fetal viability, not applicable or unspecified: Secondary | ICD-10-CM

## 2018-02-26 DIAGNOSIS — O200 Threatened abortion: Secondary | ICD-10-CM | POA: Diagnosis not present

## 2018-02-26 DIAGNOSIS — O2691 Pregnancy related conditions, unspecified, first trimester: Secondary | ICD-10-CM

## 2018-02-26 MED ORDER — METHYLERGONOVINE MALEATE 0.2 MG PO TABS: 0.2000 mg | ORAL_TABLET | Freq: Four times a day (QID) | ORAL | 0 refills | Status: DC

## 2018-02-26 NOTE — Patient Instructions (Signed)
Miscarriage A miscarriage is the loss of an unborn baby (fetus) before the 20th week of pregnancy. Most miscarriages happen during the first 3 months of pregnancy. Sometimes, a miscarriage can happen before a woman knows that she is pregnant. Having a miscarriage can be an emotional experience. If you have had a miscarriage, talk with your health care provider about any questions you may have about miscarrying, the grieving process, and your plans for future pregnancy. What are the causes? A miscarriage may be caused by:  Problems with the genes or chromosomes of the fetus. These problems make it impossible for the baby to develop normally. They are often the result of random errors that occur early in the development of the baby, and are not passed from parent to child (not inherited).  Infection of the cervix or uterus.  Conditions that affect hormone balance in the body.  Problems with the cervix, such as the cervix opening and thinning before pregnancy is at term (cervical insufficiency).  Problems with the uterus. These may include: ? A uterus with an abnormal shape. ? Fibroids in the uterus. ? Congenital abnormalities. These are problems that were present at birth.  Certain medical conditions.  Smoking, drinking alcohol, or using drugs.  Injury (trauma). In many cases, the cause of a miscarriage is not known. What are the signs or symptoms? Symptoms of this condition include:  Vaginal bleeding or spotting, with or without cramps or pain.  Pain or cramping in the abdomen or lower back.  Passing fluid, tissue, or blood clots from the vagina. How is this diagnosed? This condition may be diagnosed based on:  A physical exam.  Ultrasound.  Blood tests.  Urine tests. How is this treated? Treatment for a miscarriage is sometimes not necessary if you naturally pass all the tissue that was in your uterus. If necessary, this condition may be treated with:  Dilation and  curettage (D&C). This is a procedure in which the cervix is stretched open and the lining of the uterus (endometrium) is scraped. This is done only if tissue from the fetus or placenta remains in the body (incomplete miscarriage).  Medicines, such as: ? Antibiotic medicine, to treat infection. ? Medicine to help the body pass any remaining tissue. ? Medicine to reduce (contract) the size of the uterus. These medicines may be given if you have a lot of bleeding. If you have Rh negative blood and your baby was Rh positive, you will need a shot of a medicine called Rh immunoglobulinto protect your future babies from Rh blood problems. "Rh-negative" and "Rh-positive" refer to whether or not the blood has a specific protein found on the surface of red blood cells (Rh factor). Follow these instructions at home: Medicines   Take over-the-counter and prescription medicines only as told by your health care provider.  METHERGINE 4 doses one pill every 6 hours to help with uterus and bleeding  Do not take NSAIDs, such as aspirin and ibuprofen, unless they are approved by your health care provider. These medicines can cause bleeding. Activity  Rest as directed. Ask your health care provider what activities are safe for you.  Have someone help with home and family responsibilities during this time. General instructions  Keep track of the number of sanitary pads you use each day and how soaked (saturated) they are. Write down this information.  Monitor the amount of tissue or blood clots that you pass from your vagina. Save any large amounts of tissue for your health  care provider to examine.  Do not use tampons, douche, or have sex until your health care provider approves.  To help you and your partner with the process of grieving, talk with your health care provider or seek counseling.  When you are ready, meet with your health care provider to discuss any important steps you should take for your  health. Also, discuss steps you should take to have a healthy pregnancy in the future.  Keep all follow-up visits as told by your health care provider. This is important. Where to find more information  The American Congress of Obstetricians and Gynecologists: www.acog.org  U.S. Department of Health and Cytogeneticist of Women's Health: http://hoffman.com/ Contact a health care provider if:  You have a fever or chills.  You have a foul smelling vaginal discharge.  You have more bleeding instead of less. Get help right away if:  You have severe cramps or pain in your back or abdomen.  You pass blood clots or tissue from your vagina that is walnut-sized or larger.  You soak more than 1 regular sanitary pad in an hour.  You become light-headed or weak.  You pass out.  You have feelings of sadness that take over your thoughts, or you have thoughts of hurting yourself. Summary  Most miscarriages happen in the first 3 months of pregnancy. Sometimes miscarriage happens before a woman even knows that she is pregnant.  Follow your health care provider's instruction for home care. Keep all follow-up appointments.  To help you and your partner with the process of grieving, talk with your health care provider or seek counseling. This information is not intended to replace advice given to you by your health care provider. Make sure you discuss any questions you have with your health care provider. Document Released: 07/18/2000 Document Revised: 02/28/2016 Document Reviewed: 02/28/2016 Elsevier Interactive Patient Education  2019 [employer].

## 2018-02-26 NOTE — Progress Notes (Signed)
  HPI: Pt has had bleeding this pregnancy.  Last [place] at [facility] showed loss of gest sac and embryo, whereas before she had pregnancy near cornua of right side of uterus w FHTs.  Beta was still elevated last week.  Bleeding has slowed this week.  Min to no pain.    Ultrasound demonstrates empty but thickened uterus These findings are miscarriage  PMHx: She  has a past medical history of Pre-diabetes. Also,  has no past surgical history on file., family history includes COPD in her maternal grandmother and paternal grandfather; Diabetes in her brother, father, maternal grandfather, maternal grandmother, paternal grandfather, and paternal grandmother.,  reports that she has never smoked. She has never used smokeless tobacco. She reports current alcohol use. She reports current drug use. Drug: Marijuana.  She has a current medication list which includes the following prescription(s): acetaminophen, hydrocodone-acetaminophen, lidocaine, and methylergonovine. Also, is allergic to haldol [haloperidol] and depo-provera [medroxyprogesterone acetate].  Review of Systems  All other systems reviewed and are negative.  Objective: BP 100/70   Wt 178 lb (80.7 kg)   LMP 12/16/2017 (Approximate)   BMI 39.91 kg/m   Physical examination Constitutional NAD, Conversant  Skin No rashes, lesions or ulceration.   Extremities: Moves all appropriately.  Normal ROM for age. No lymphadenopathy.  Neuro: Grossly intact  Psych: Oriented to PPT.  Normal mood. Normal affect.   US Ob Transvaginal  Result Date: 02/26/2018 Patient Name: Kristina Avery DOB: May 25, 1990 MRN: [id number] ULTRASOUND REPORT Location: [facility] Date of Service: 02/26/2018 Indications:Threatened AB Findings: No intrauterine pregnancy seen on today's ultrasound. Endometrium measures 13.44mm. It is thickened, heterogeneous and vascular suggesting retained products. Uterus is arcuate in appearance.  Right Ovary seen with dominant follicle. Left ovary  is not seen. Survey of the adnexa demonstrates no adnexal masses. There is no free peritoneal fluid in the cul de sac. Impression: 1. Thickened, heterogeneous endometrium with vascular flow suggesting retained products of conception. 2. Uterus is arcuate in appearance. Recommendations: 1.Clinical correlation with the patient's History and Physical Exam. Darlina Guys, RDMS RVT Review of ULTRASOUND.    I have personally reviewed images and report of recent ultrasound done at [facility].    Plan of management to be discussed with patient. Annamarie Major, MD, FACOG [facility], [facility] 02/26/2018  11:38 AM    Assessment:  Abnormal pregnancy in first trimester - Plan: Beta hCG quant (ref lab) Still concern for arcuate vs septate uterus Methergine to assist closure w this miscarriage. D&C discussed as option if pain or bleeding persists Wants to try for pregnancy, advised to wait until after next normal cycle Testing for uterus discussed; not always easy to tell without surgery.  Consider SHG.  A total of 15 minutes were spent face-to-face with the patient during this encounter and over half of that time dealt with counseling and coordination of care.  Annamarie Major, MD, [facility], [facility] 02/26/2018  11:39 AM

## 2018-02-27 LAB — BETA HCG QUANT (REF LAB): hCG Quant: 183 m[IU]/mL

## 2018-02-27 NOTE — Progress Notes (Signed)
Beta levels coming down well LM to call

## 2018-03-11 ENCOUNTER — Telehealth: Payer: Self-pay

## 2018-03-11 NOTE — Telephone Encounter (Signed)
Not sure why untestable; would allow them to test again anytime.

## 2018-03-11 NOTE — Telephone Encounter (Signed)
Pt aware.

## 2018-03-11 NOTE — Telephone Encounter (Signed)
Pt has had miscarriage recently.  She got her meds later than they were rx'd d/t work and not being able to get up here. Around that same time she had to do a drug test for work which came back 'untestable'. She took the meds the day before/day of drug test. She told them she was on med and vicodin.  They told her it would be okay just to tell them when they call.  Would the meds she was on for miscarriage cause her drug test to be 'untestable'?  Work needs documentation that it's going to take awhile to get out of her system before they can reschedule her for drug test.  [phone]

## 2018-04-28 ENCOUNTER — Ambulatory Visit (INDEPENDENT_AMBULATORY_CARE_PROVIDER_SITE_OTHER): Payer: Medicaid Other | Admitting: Obstetrics & Gynecology

## 2018-04-28 ENCOUNTER — Other Ambulatory Visit: Payer: Self-pay

## 2018-04-28 ENCOUNTER — Telehealth: Payer: Self-pay | Admitting: Obstetrics & Gynecology

## 2018-04-28 ENCOUNTER — Encounter: Payer: Self-pay | Admitting: Obstetrics & Gynecology

## 2018-04-28 ENCOUNTER — Ambulatory Visit: Payer: Medicaid Other | Admitting: Obstetrics & Gynecology

## 2018-04-28 DIAGNOSIS — N970 Female infertility associated with anovulation: Secondary | ICD-10-CM | POA: Diagnosis not present

## 2018-04-28 DIAGNOSIS — N926 Irregular menstruation, unspecified: Secondary | ICD-10-CM | POA: Diagnosis not present

## 2018-04-28 NOTE — Telephone Encounter (Signed)
Called and left voice mail for patient to call back to be schedule °

## 2018-04-28 NOTE — Progress Notes (Addendum)
Virtual Visit via Telephone Note  I connected with Kristina Avery on 04/28/18 at 11:20 AM EDT by telephone and verified that I am speaking with the correct person using two identifiers.   I discussed the limitations, risks, security and privacy concerns of performing an evaluation and management service by telephone and the availability of in person appointments. I also discussed with the patient that there may be a patient responsible charge related to this service. The patient expressed understanding and agreed to proceed. She was at home and I was in my office.   History of Present Illness: Pt is a 28 yo G2P0020 WF , with miscarriage 7 years ago and then again 2 mos ago, with no pregnancies in between despite no birth contol and active trying.  Pt has irregular although about monthly cycles and has had BTB in past between cycles; some dysmenorrhea.  Denies hirsutism, acne.  Obesity w BMI 39.She is concerned about future fertility options, desiring pregnancy.  Past medical history:I have reviewed and confirmed the past medical history in the chart. Diabetes: prediabetes in past PSH: none SH: She reports that she has never smoked. She has never used smokeless tobacco. She reports current alcohol use. She reports current drug use. Drug: Marijuana. FH: family history includes COPD in her maternal grandmother and paternal grandfather; Diabetes in her brother, father, maternal grandfather, maternal grandmother, paternal grandfather, and paternal grandmother. Medications: reviewed medication list in the chart Allergies: reviewed allergy section in the chart Review of Systems: Negative for chest pain and shortness of breath Review of all other systems is negative   Observations/Objective: No exam today, due to telephone eVisit due to [facility] virus restriction on elective visits and procedures.  Prior visits reviewed and ultrasounds/labs.  Assessment and Plan: 1. Infertility associated with  anovulation LABS soon - FSH/LH; Future - Estradiol; Future - TSH; Future - Anti mullerian hormone; Future  2. Irregular menses LABS soon; also needs to have SHG vs Endosee hysteroscopy to assess uterus for abnormality; will arrange after procedural ban is lifted (due to [place] virus) - FSH/LH; Future - Estradiol; Future - TSH; Future - Anti mullerian hormone; Future  Follow Up Instructions: OK to try for pregnancy, with ultrasund and labs for prenatals if pos home test; benefits of gathering above information discussed   I discussed the assessment and treatment plan with the patient. The patient was provided an opportunity to ask questions and all were answered. The patient agreed with the plan and demonstrated an understanding of the instructions.   The patient was advised to call back or seek an in-person evaluation if the symptoms worsen or if the condition fails to improve as anticipated.  I provided 12 minutes of non-face-to-face time during this encounter.   Letitia Libra, MD [facility], [facility] 04/28/2018  12:13 PM

## 2018-04-28 NOTE — Telephone Encounter (Signed)
-----   Message from Nadara Mustard, MD sent at 04/28/2018 12:19 PM EDT ----- Regarding: Sch Lab appt soon

## 2018-04-30 ENCOUNTER — Ambulatory Visit: Payer: Medicaid Other | Admitting: Obstetrics & Gynecology

## 2019-08-29 IMAGING — US US OB COMP LESS 14 WK
1 series · 13 of 28 positions shown · non-contrast
Comparison: None.

CLINICAL DATA: 27-year-old, LMP 12/16/2017 ([DATE] weeks 6 days),
presenting with vaginal bleeding. Quantitative beta hCG 587.

EXAM:
OBSTETRIC <14 WK US AND TRANSVAGINAL OB US
TECHNIQUE: Both transabdominal and transvaginal ultrasound examinations were
performed for complete evaluation of the gestation as well as the
maternal uterus, adnexal regions, and pelvic cul-de-sac.
Transvaginal technique was performed to assess early pregnancy.

[Series 1: us ob comp less 14 wk · 0.19mm/px · 13 of 81 slices shown]
[im 3/81]
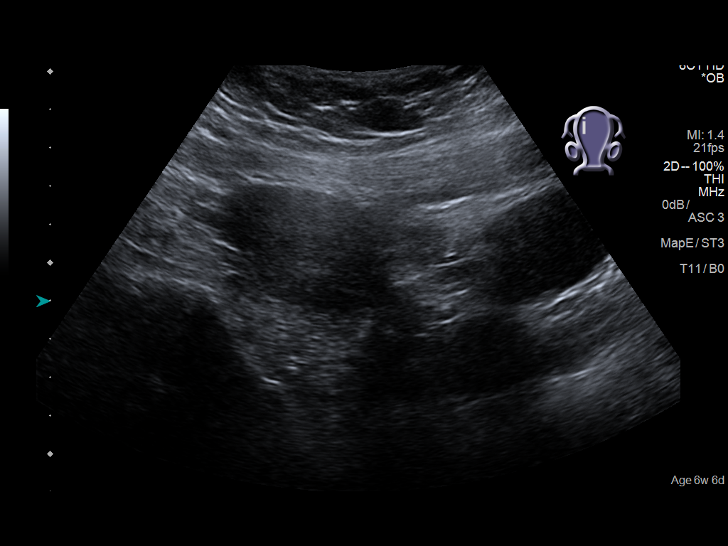
[im 9/81]
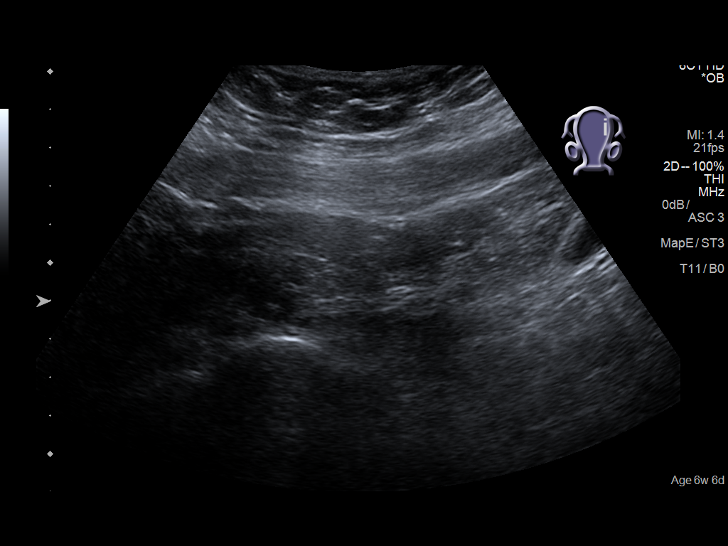
[im 15/81]
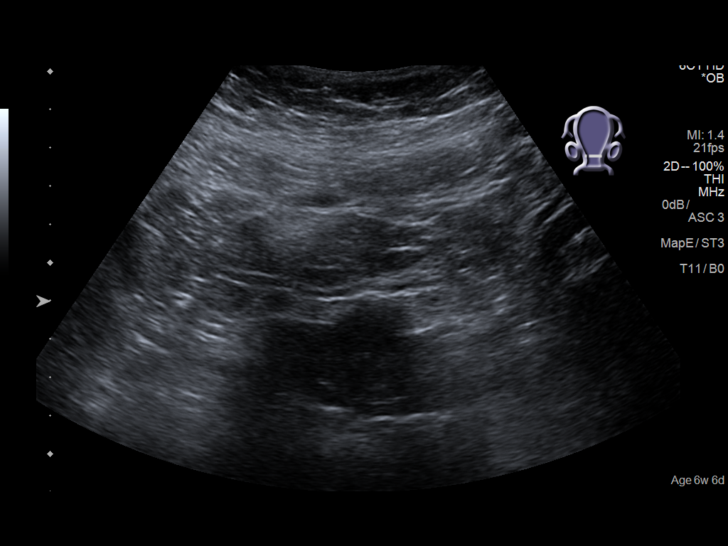
[im 21/81]
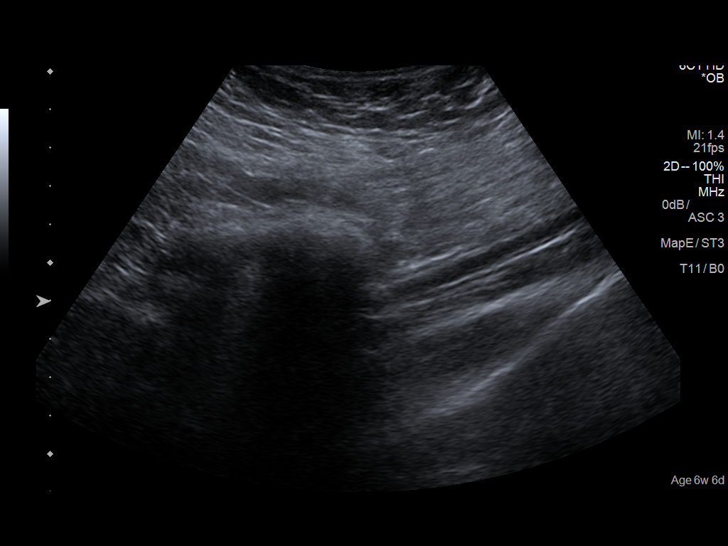
[im 27/81]
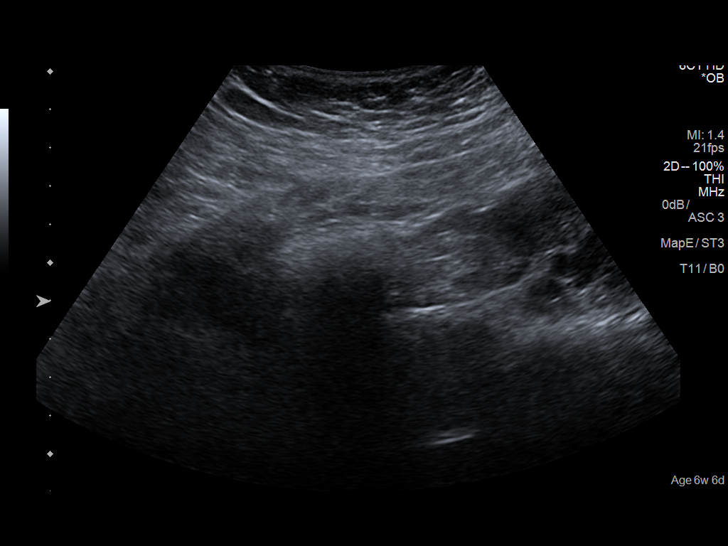
[im 33/81]
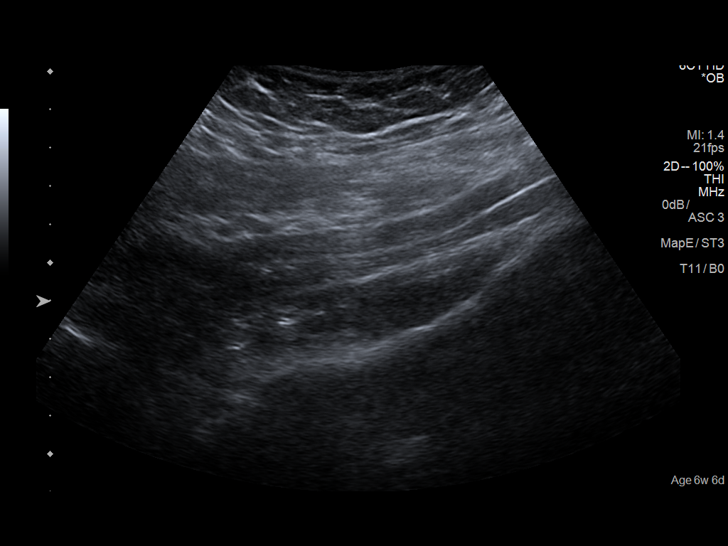
[im 42/81]
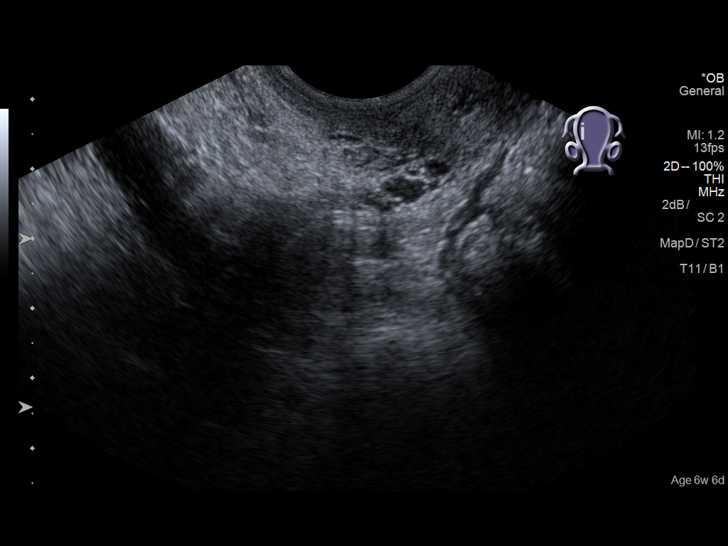
[im 48/81]
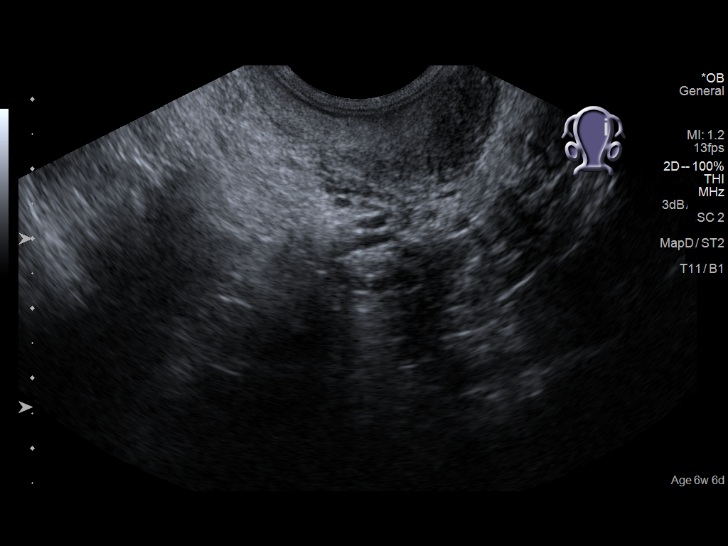
[im 54/81]
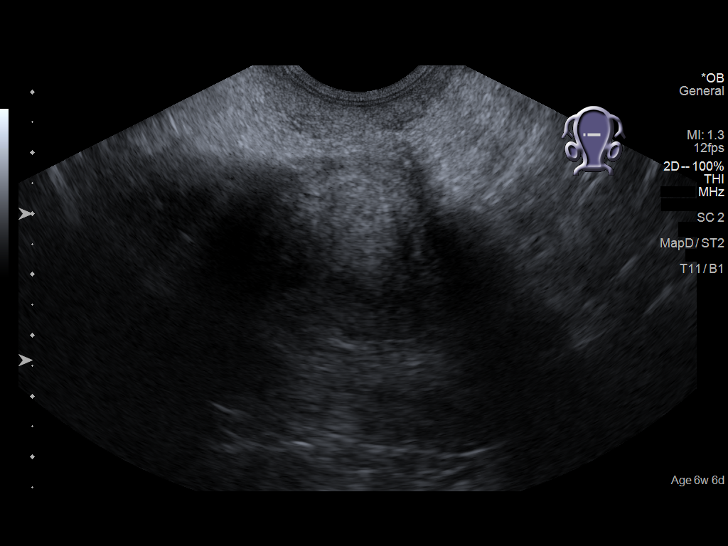
[im 60/81]
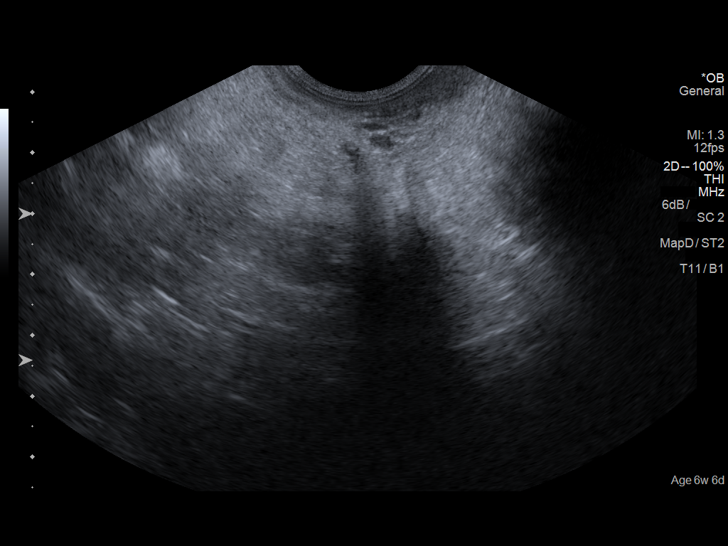
[im 66/81]
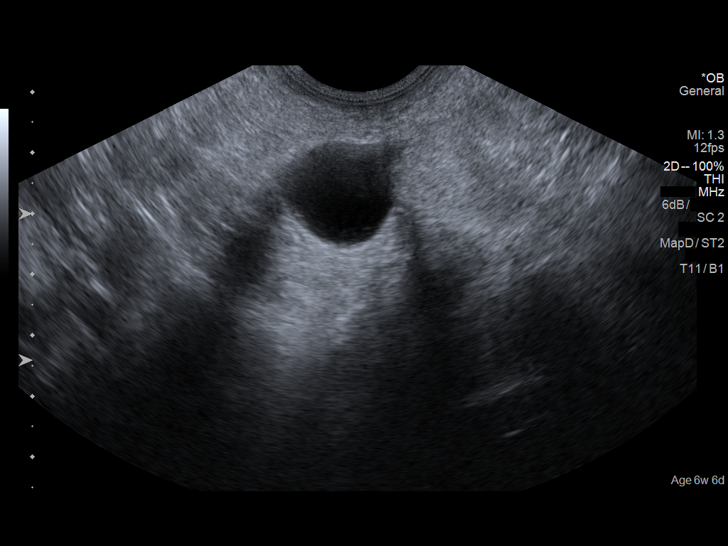
[im 72/81]
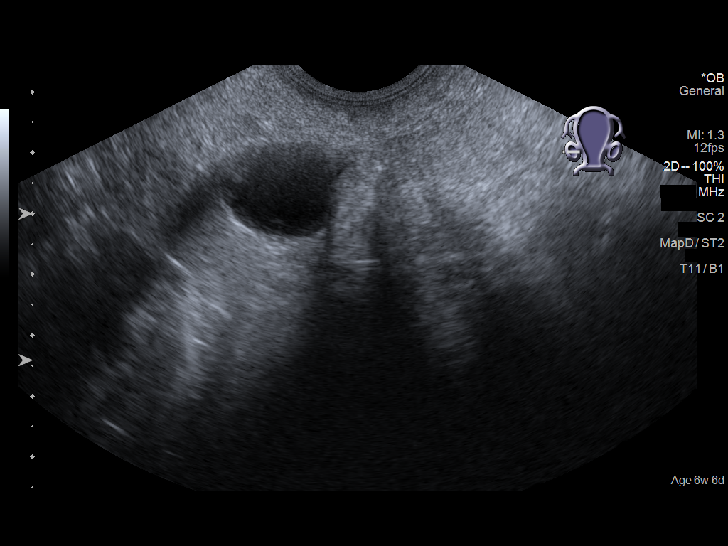
[im 78/81]
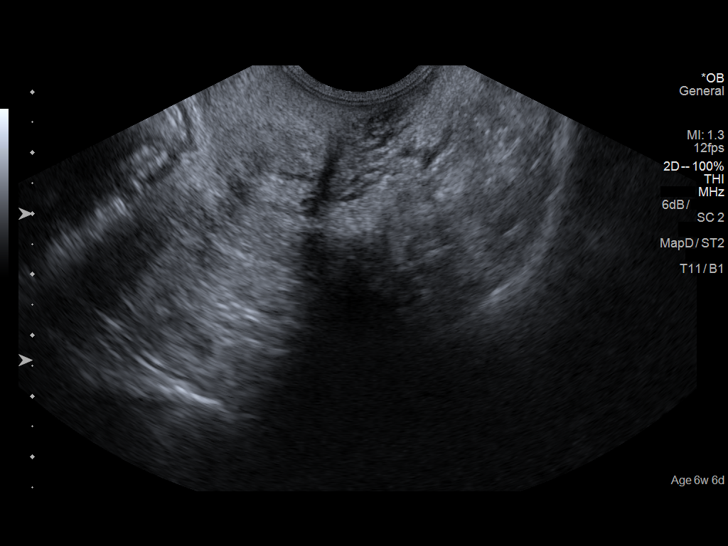

[13 of 28 positions shown; findings below may reference images not displayed]

FINDINGS: Intrauterine gestational sac: Not present.

Yolk sac:  Not present.

Embryo:  Not present.

Cardiac Activity: Not applicable.

MSD: Not applicable.

CRL:  Not applicable.

US EDC: Not applicable.

Subchorionic hemorrhage:  Not visualized.

Maternal uterus/adnexae: Endometrial thickening up to 1.4 cm.
Normal-appearing RIGHT ovary measuring approximately 3.2 x 2.0 x
cm, containing a small follicular cyst. LEFT ovary not visualized.
Trace free fluid in the cul de sac.
IMPRESSION: 1. No visible intrauterine gestational sac. Given the low
quantitative beta hCG, it may be too early for visualization of the
gestational sac and the estimated gestational age by LMP may be
incorrect. Follow-up serial beta hCG and repeat ultrasound imaging
in 1-2 weeks may be helpful.
2. Endometrial thickening up to 1.4 cm.
3. Normal appearing RIGHT ovary. Nonvisualization of the LEFT ovary.

## 2019-08-31 IMAGING — US US OB TRANSVAGINAL
1 series · 14 of 28 positions shown · non-contrast
Comparison: 02/02/2018 pelvic ultrasound.

CLINICAL DATA: 27 y/o F; vaginal bleeding and pregnancy.
Quantitative beta hCG 893.

EXAM:
OBSTETRIC <14 WK US AND TRANSVAGINAL OB US
TECHNIQUE: Both transabdominal and transvaginal ultrasound examinations were
performed for complete evaluation of the gestation as well as the
maternal uterus, adnexal regions, and pelvic cul-de-sac.
Transvaginal technique was performed to assess early pregnancy.

[Series 1: us ob transvaginal · 0.19mm/px · 14 of 91 slices shown]
[im 4/91]
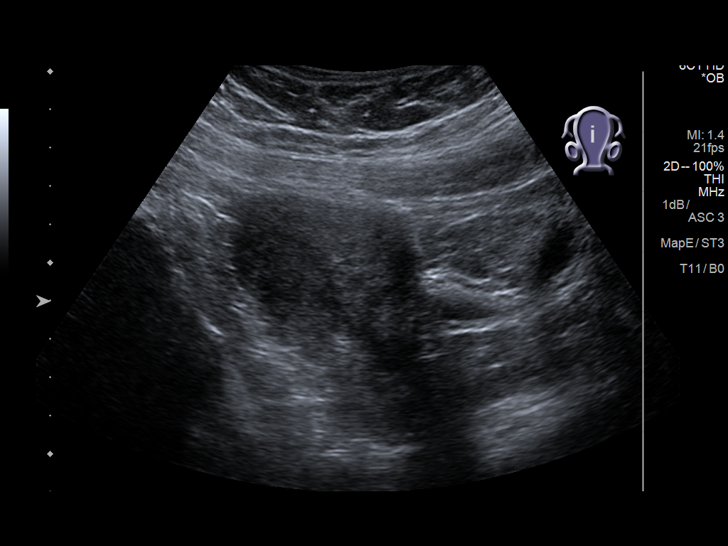
[im 11/91]
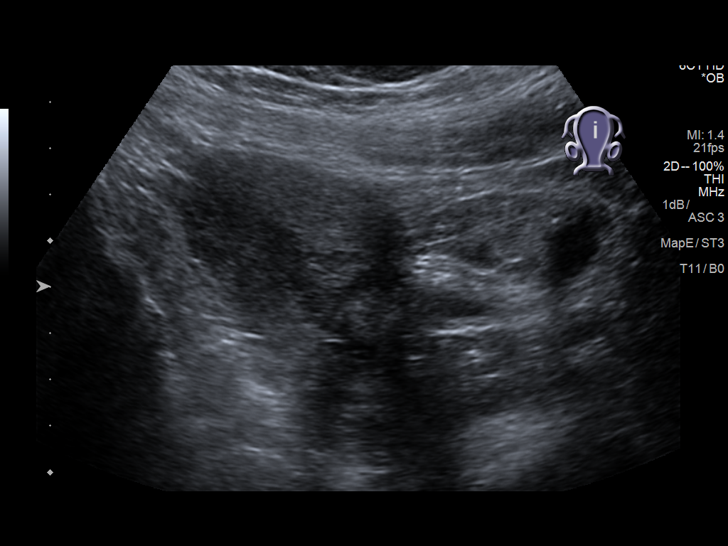
[im 17/91]
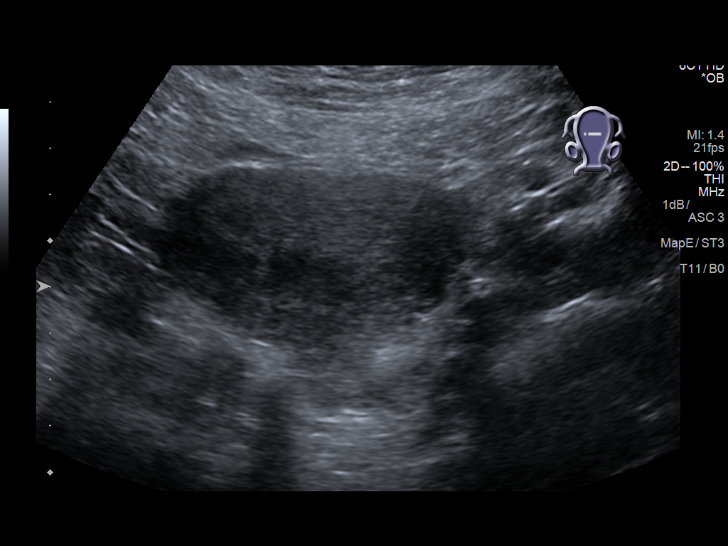
[im 24/91]
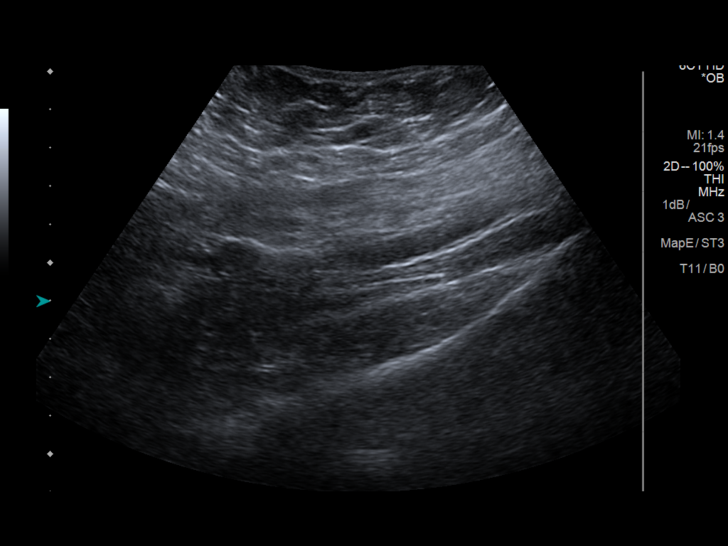
[im 31/91]
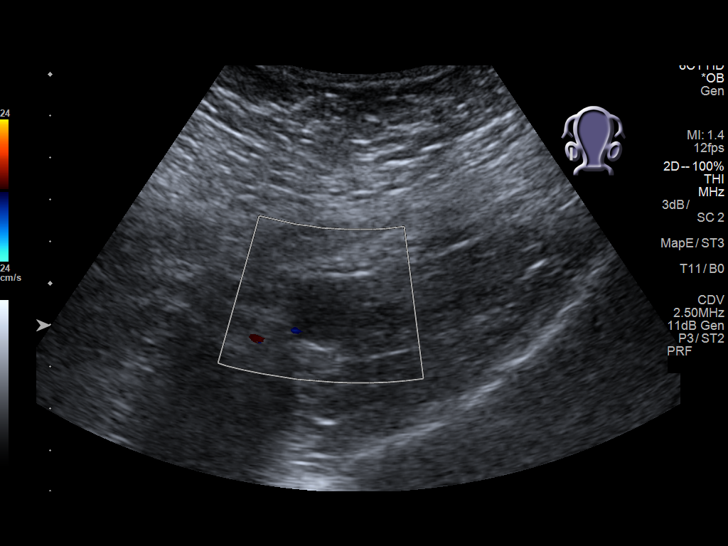
[im 37/91]
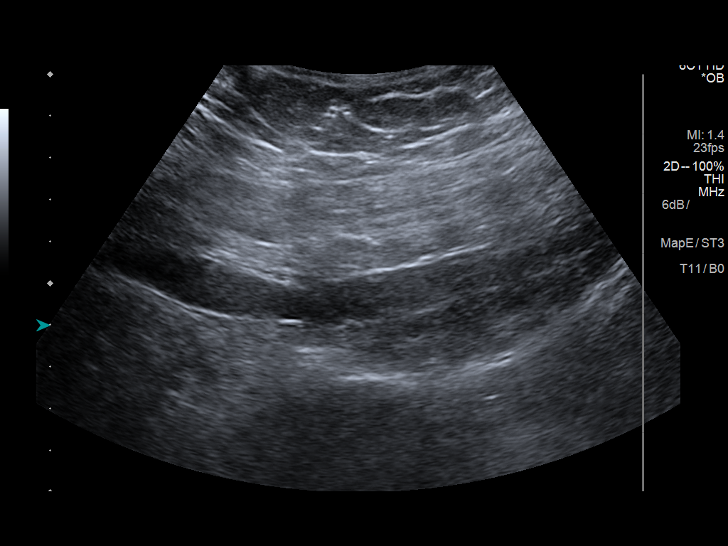
[im 44/91]
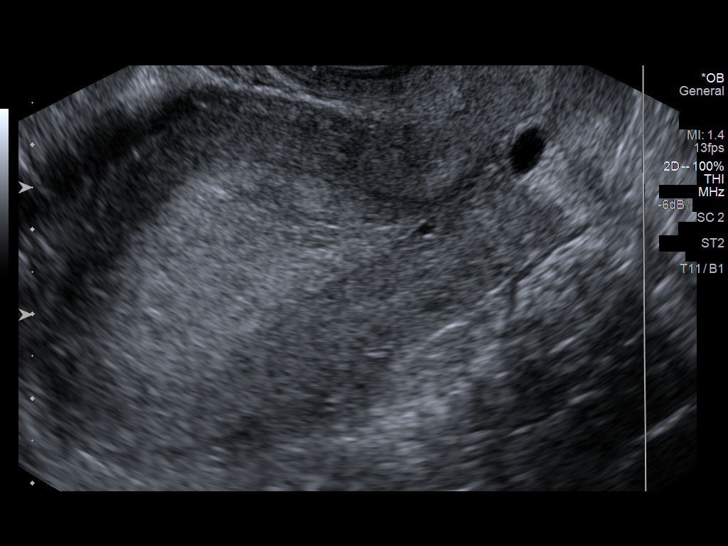
[im 51/91]
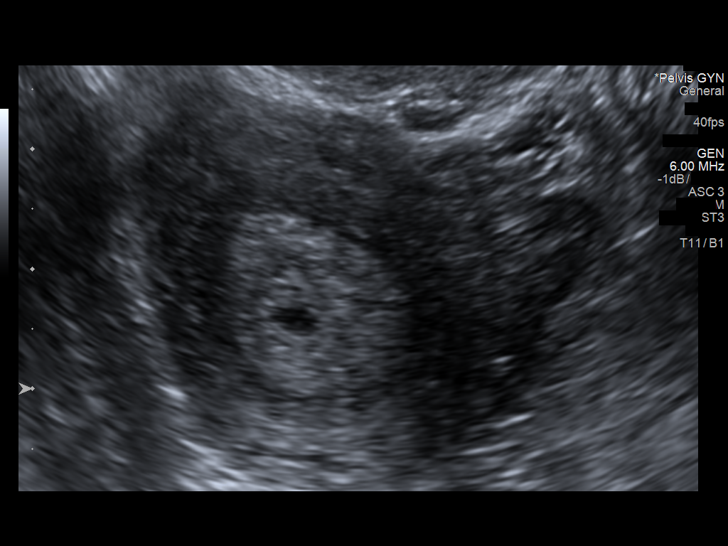
[im 57/91]
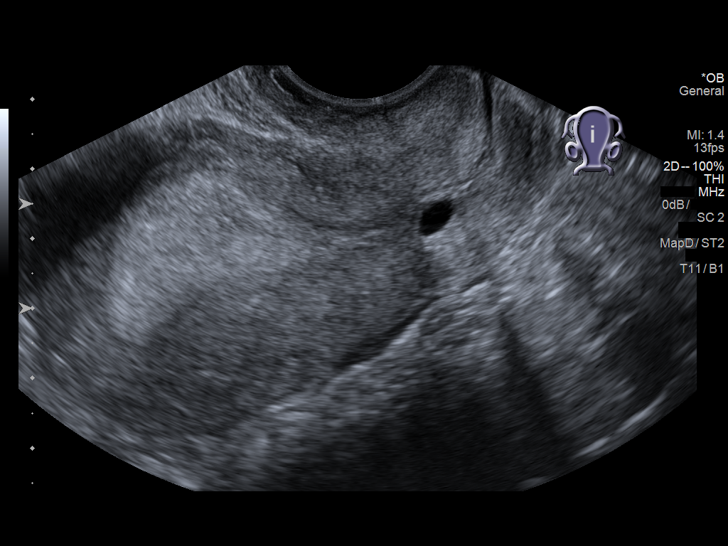
[im 64/91]
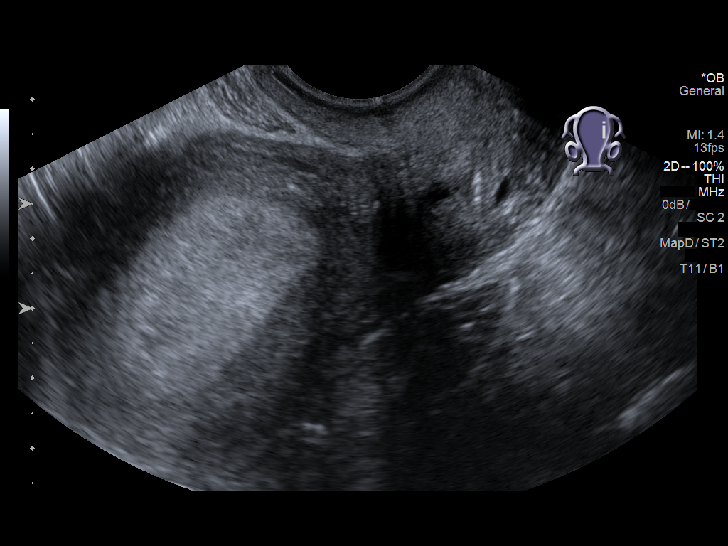
[im 71/91]
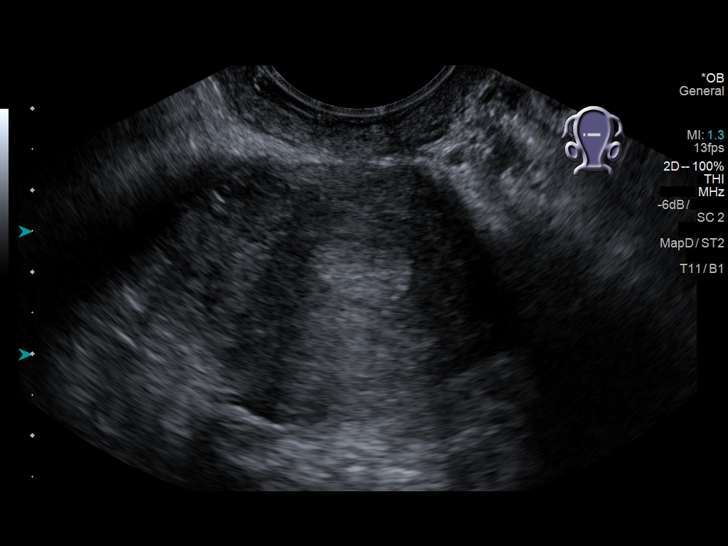
[im 77/91]
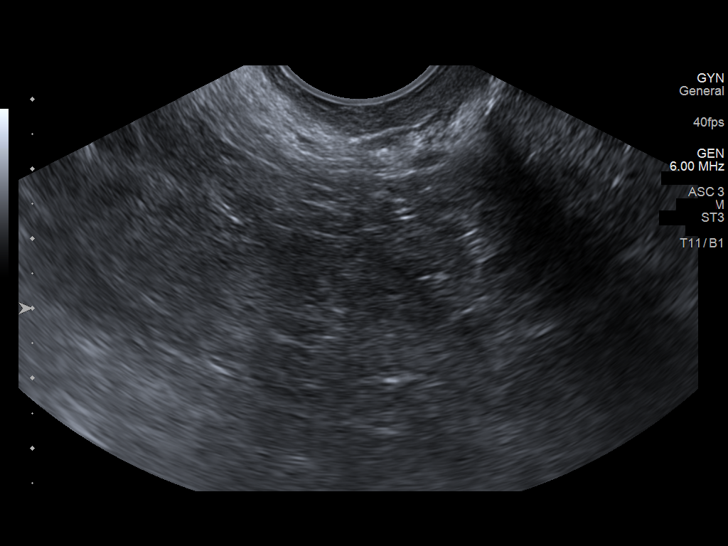
[im 84/91]
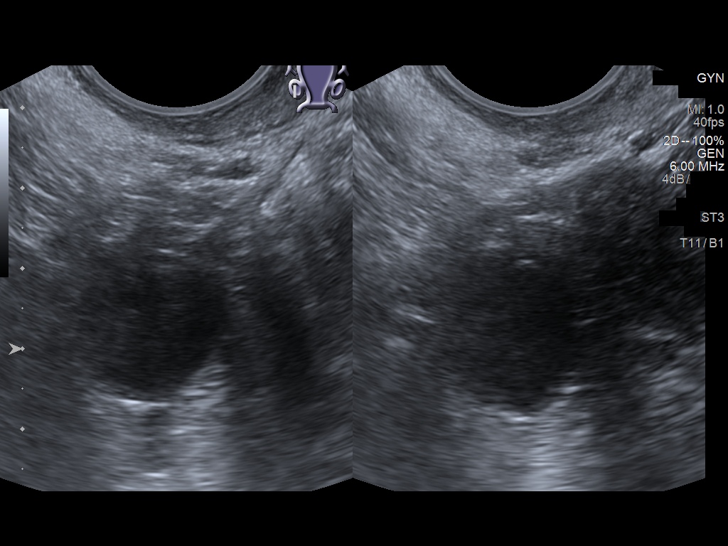
[im 91/91]
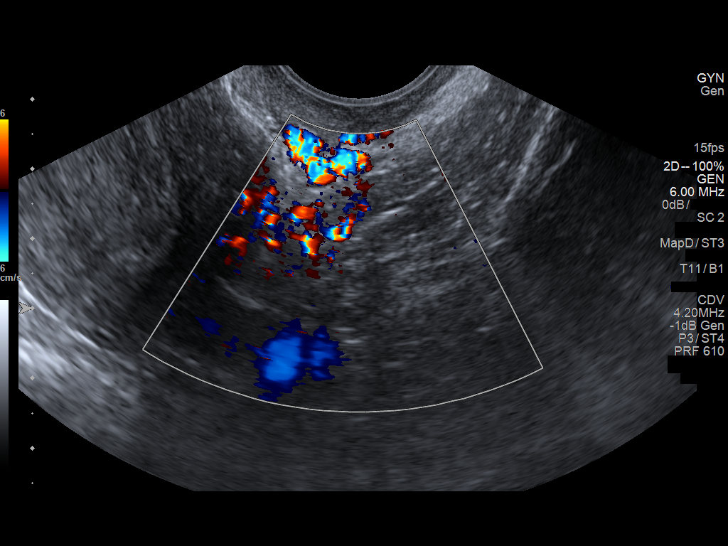

[14 of 28 positions shown; findings below may reference images not displayed]

FINDINGS: Intrauterine gestational sac: Single

Yolk sac:  Not Visualized.

Embryo:  Not Visualized.

Cardiac Activity: Not Visualized.

MSD: 2.9 mm   5 w   0 d

Subchorionic hemorrhage:  None visualized.

Maternal uterus/adnexae: Right ovarian simple cyst measuring up to
1.6 cm.
IMPRESSION: Probable early intrauterine gestational sac, but no yolk sac, fetal
pole, or cardiac activity yet visualized. Recommend follow-up
quantitative B-HCG levels and follow-up US in 14 days to assess
viability. This recommendation follows SRU consensus guidelines:
Diagnostic Criteria for Nonviable Pregnancy Early in the First
Trimester. N Engl J Med 2918; [DATE].

## 2019-09-15 IMAGING — US US OB COMP LESS 14 WK
1 series · 13 of 28 positions shown · non-contrast
Comparison: None.

CLINICAL DATA: 27-year-old female reportedly 6 weeks 5 days
pregnant with vaginal bleeding for the past 3 weeks.

EXAM:
OBSTETRIC <14 WK US AND TRANSVAGINAL OB US
TECHNIQUE: Both transabdominal and transvaginal ultrasound examinations were
performed for complete evaluation of the gestation as well as the
maternal uterus, adnexal regions, and pelvic cul-de-sac.
Transvaginal technique was performed to assess early pregnancy.

[Series 1: us ob comp less 14 wk · 0.23mm/px · 13 of 90 slices shown]
[im 4/90]
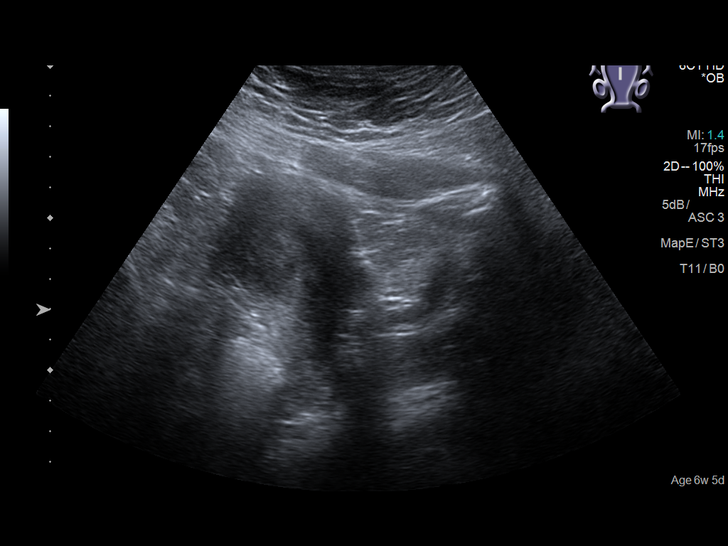
[im 10/90]
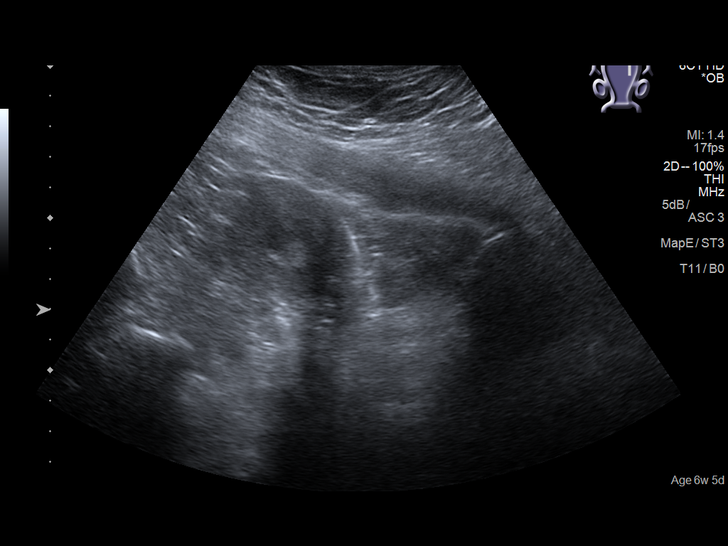
[im 17/90]
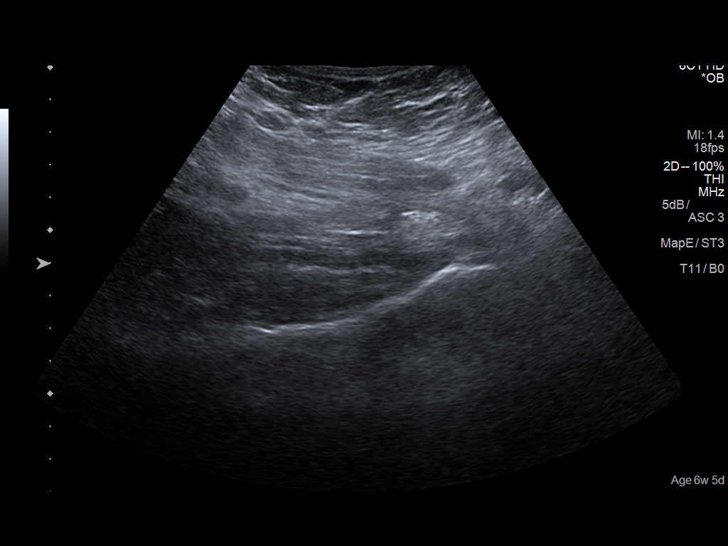
[im 24/90]
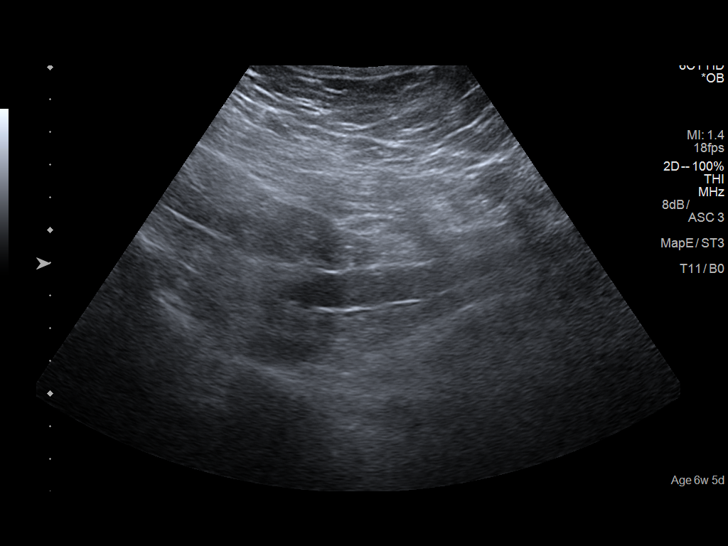
[im 30/90]
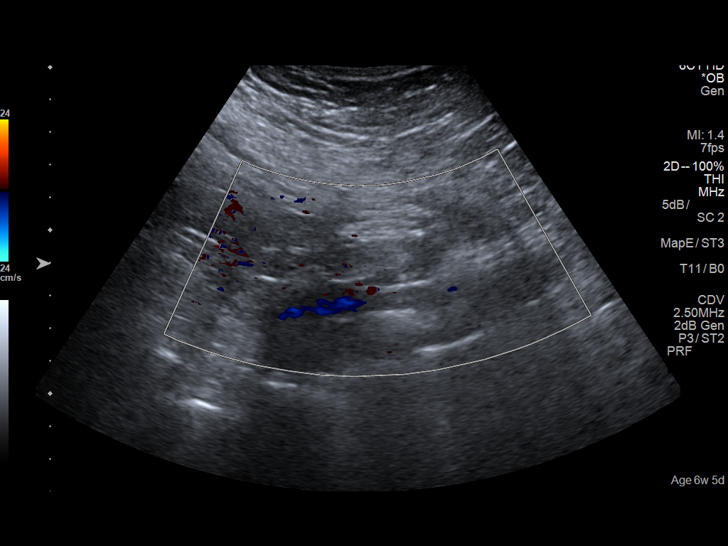
[im 37/90]
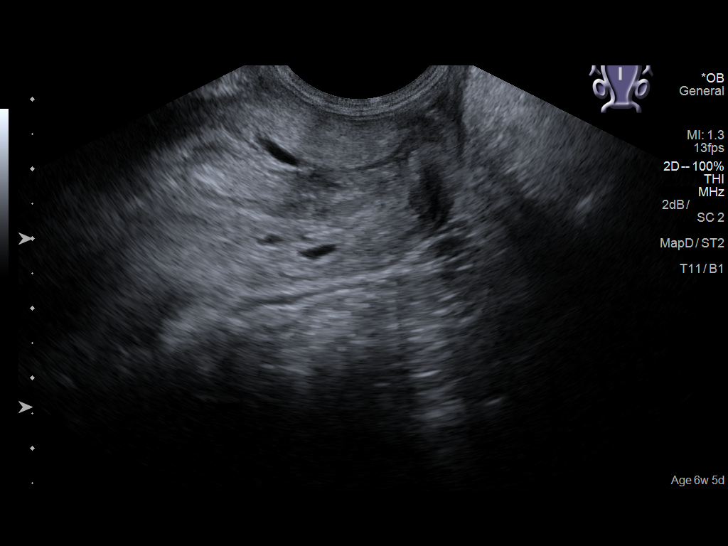
[im 47/90]
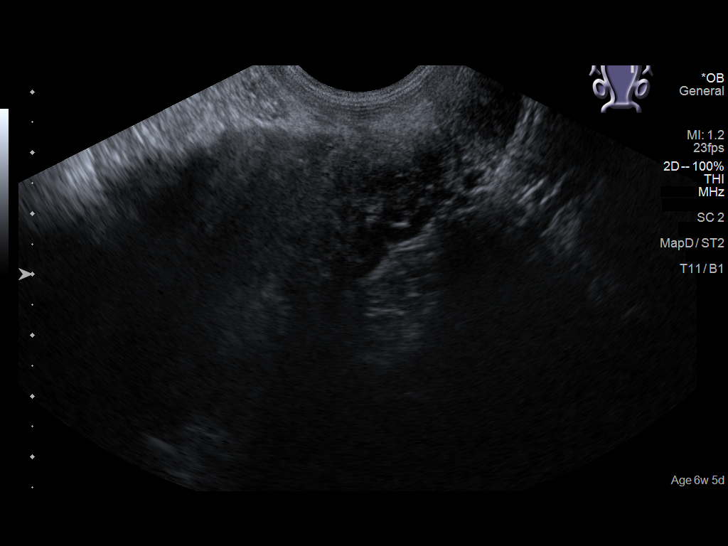
[im 53/90]
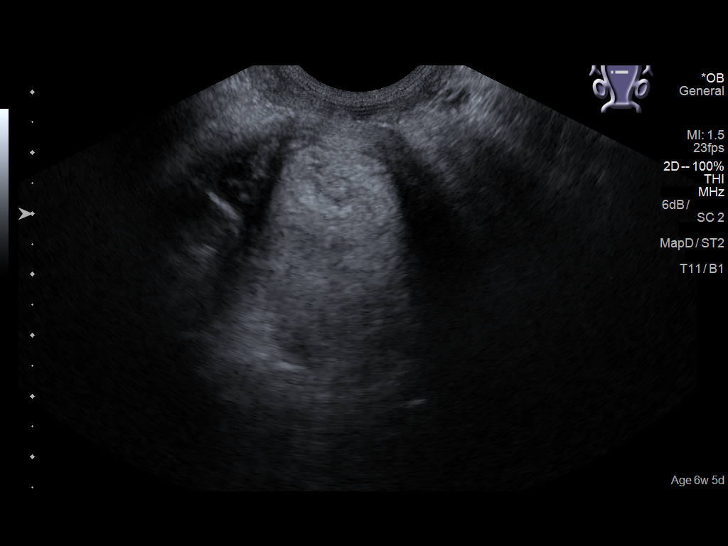
[im 60/90]
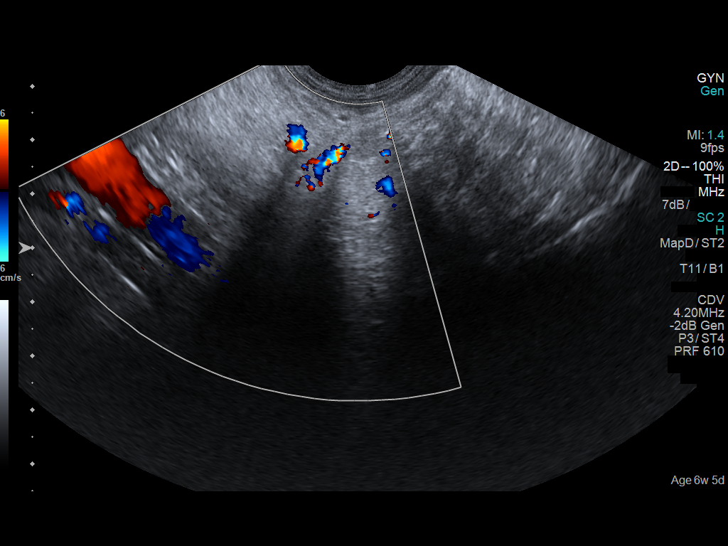
[im 66/90]
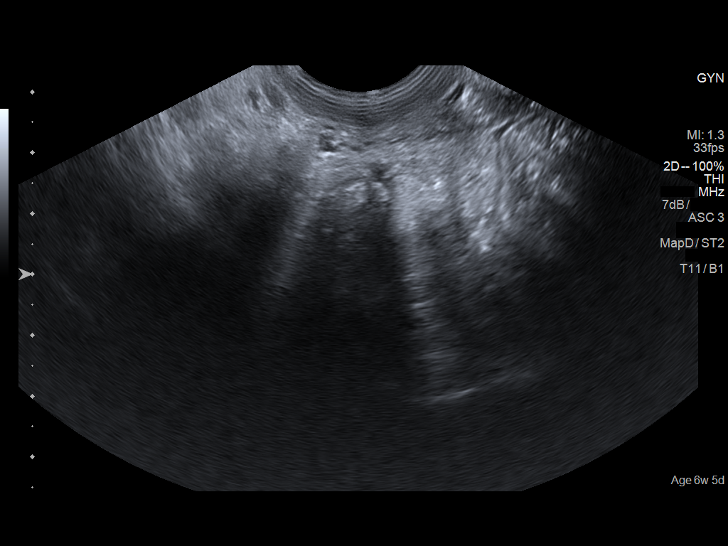
[im 73/90]
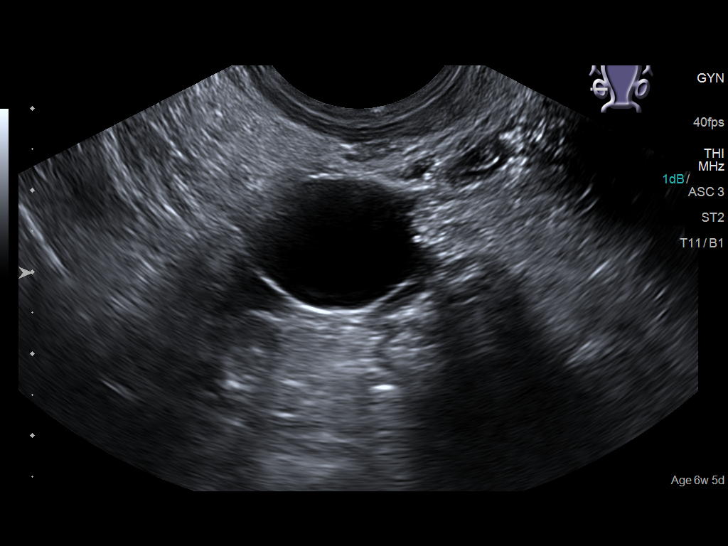
[im 80/90]
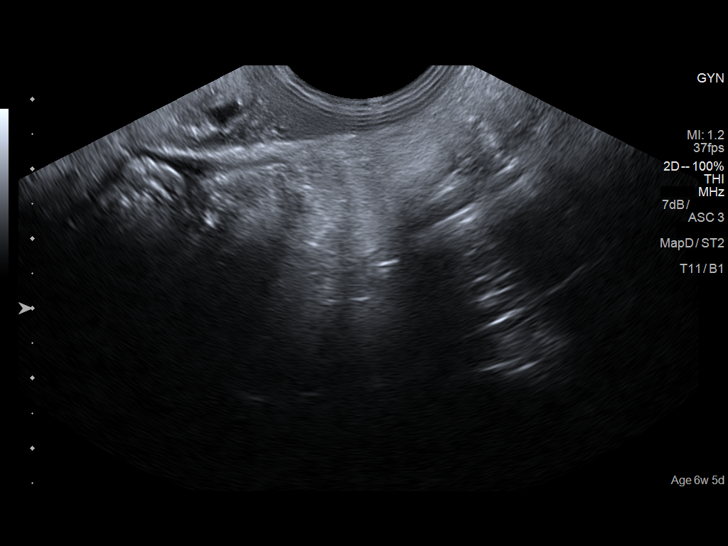
[im 86/90]
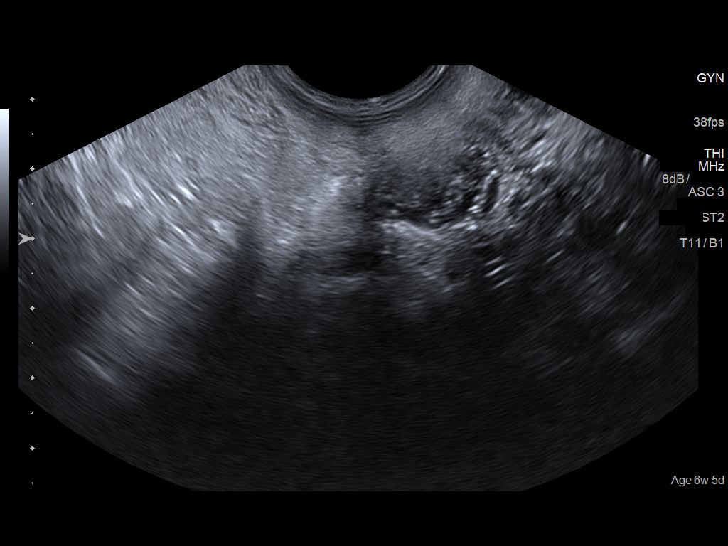

[13 of 28 positions shown; findings below may reference images not displayed]

FINDINGS: Intrauterine gestational sac: None

Yolk sac:  Not Visualized.

Embryo:  Not Visualized.

Subchorionic hemorrhage:  None visualized.

Maternal uterus/adnexae: No gestational sac visualized. The
endometrium is thickened and heterogeneous with sub endometrial
cystic spaces suggesting sloughing of the endometrial lining. A
simple follicular cyst is visualized in the right ovary. The left
ovary could not be identified. No free fluid in the pelvis.
IMPRESSION: 1. No gestational sac is visualized. The endometrium is thickened,
and irregular with peripheral cystic spaces suggesting sloughing of
the endometrial lining. Findings are suspicious but not yet
definitive for failed pregnancy. Recommend follow-up US in 10-14
days for definitive diagnosis. This recommendation follows SRU
consensus guidelines: Diagnostic Criteria for Nonviable Pregnancy
Early in the First Trimester. N Engl J Med 9817; [DATE].
2. The left ovary could not be visualized.
3. Simple follicular cyst noted incidentally in the right ovary.

## 2020-07-10 IMAGING — CR DG THORACIC SPINE 2V
1 series · 3 of 3 positions shown · non-contrast
Comparison: None.

CLINICAL DATA: Back pain

EXAM:
THORACIC SPINE 2 VIEWS

[Series 1: dg thoracic spine 2 view · 0.14mm/px · 3 of 3 slices shown]
[im 1/3]
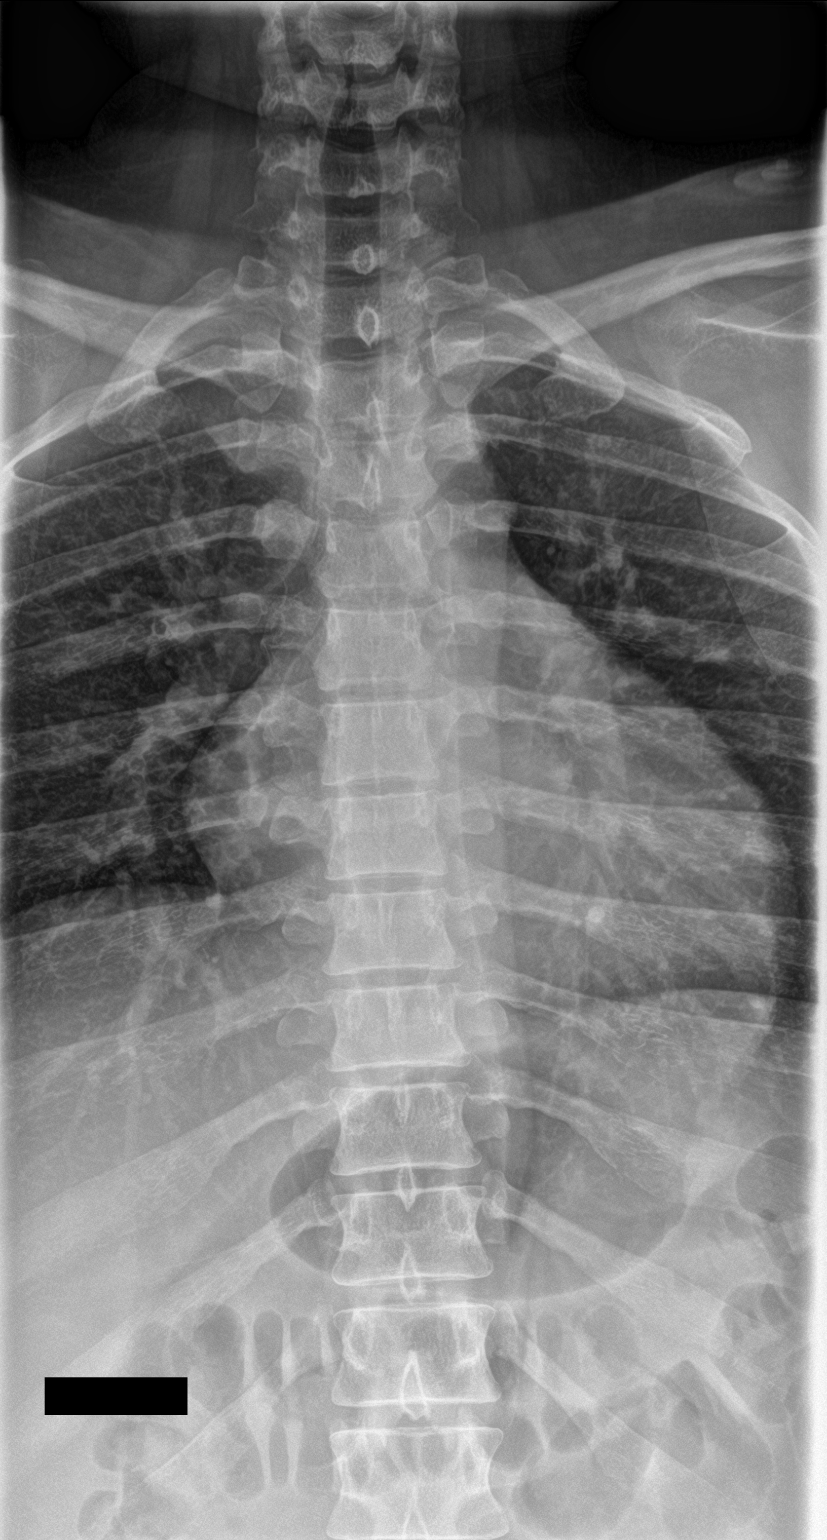
[im 2/3]
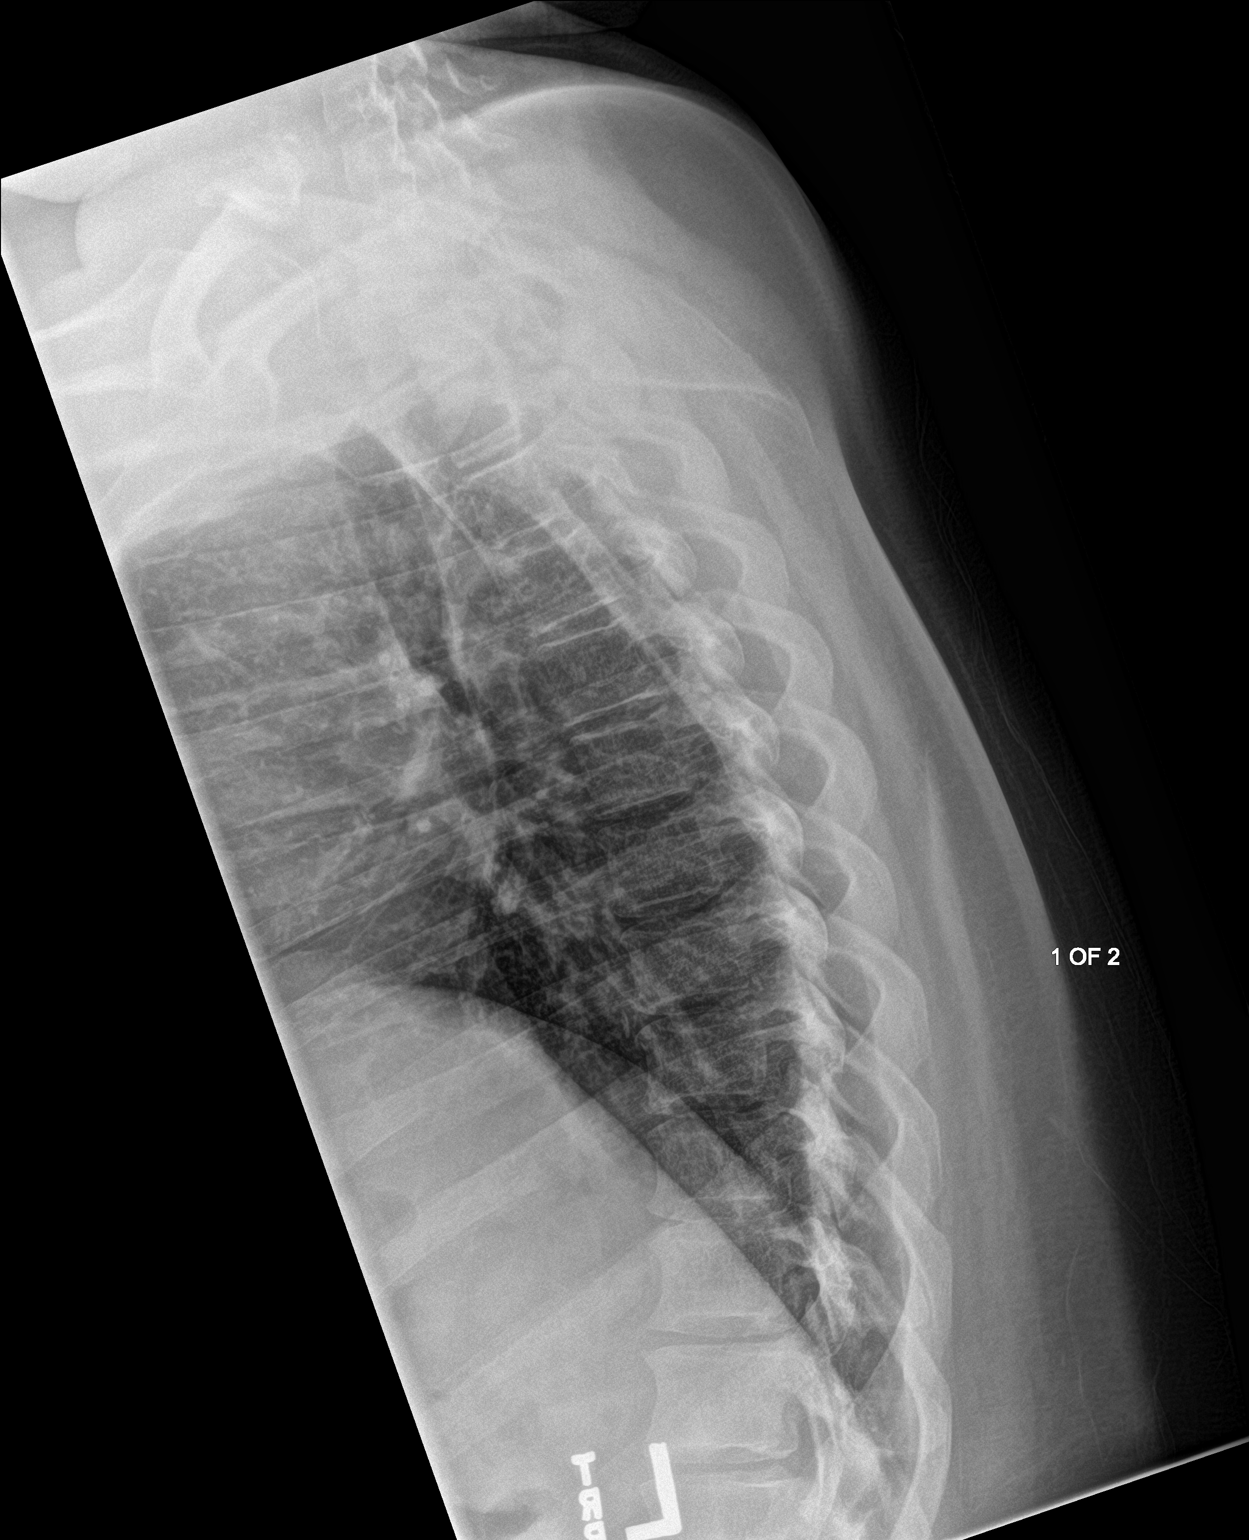
[im 3/3]
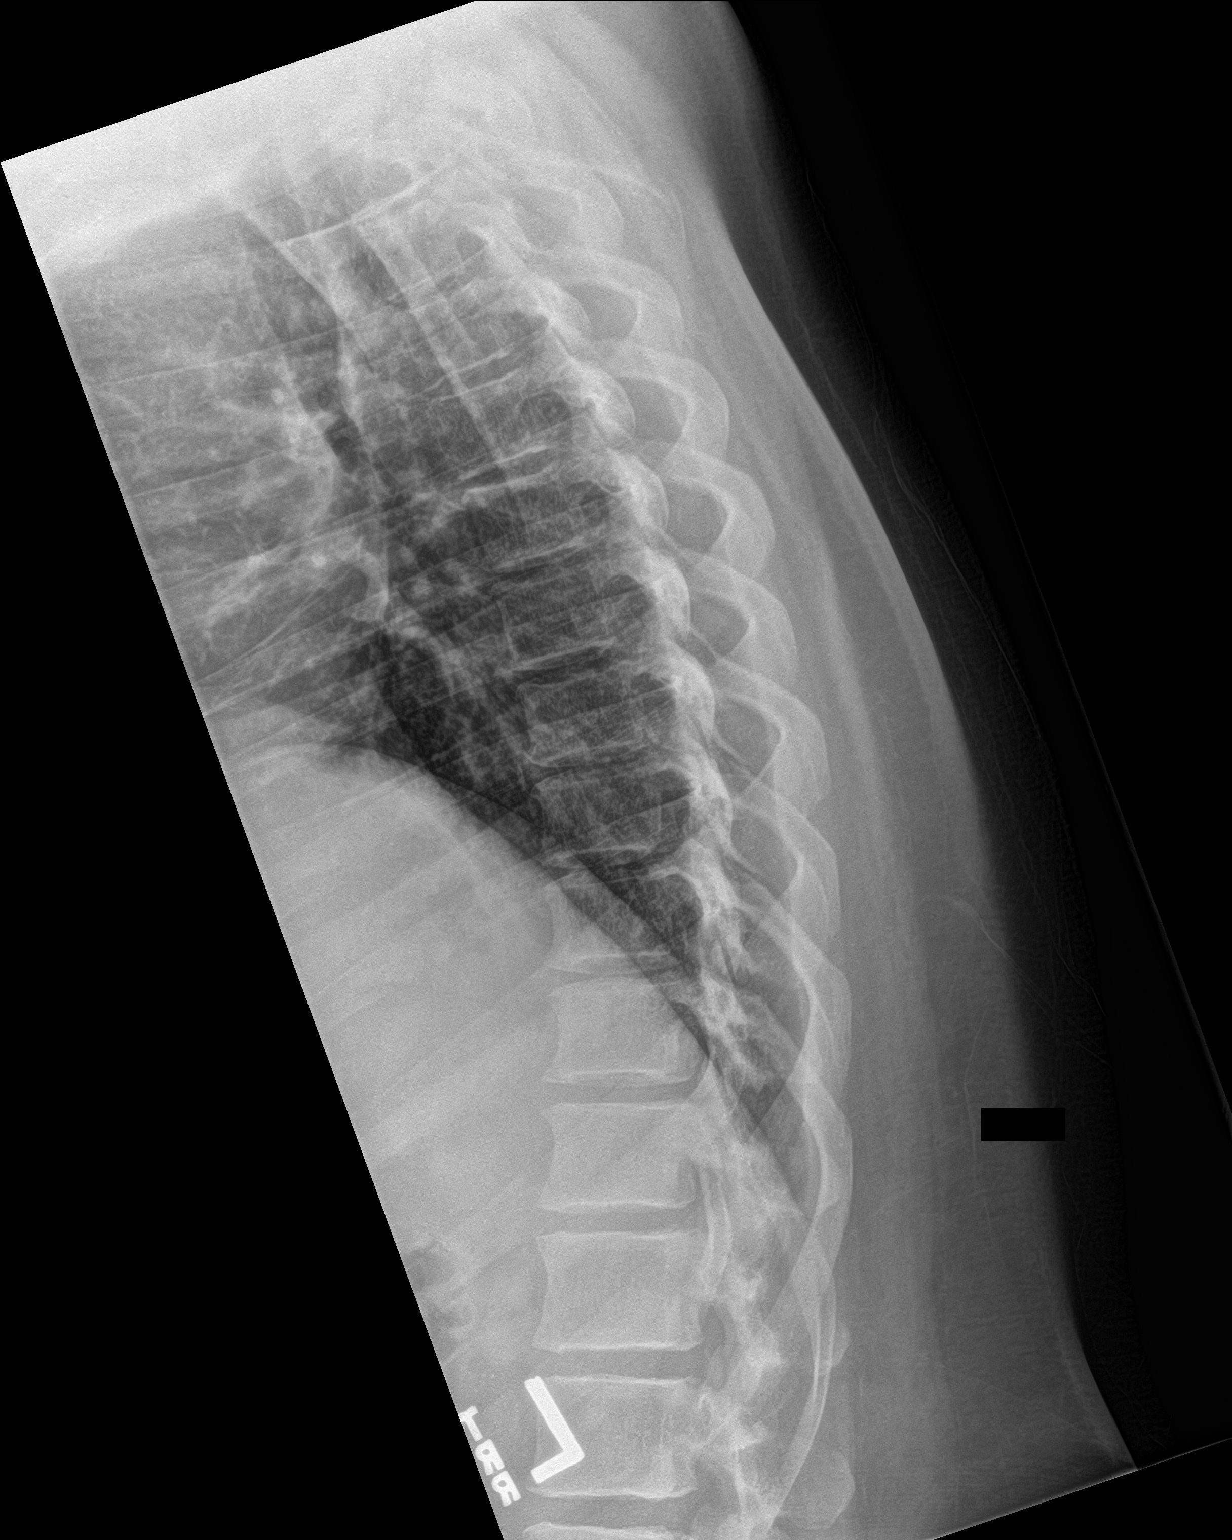

[3 of 3 positions shown; findings below may reference images not displayed]

FINDINGS: There is no evidence of thoracic spine fracture. Alignment is
normal. No other significant bone abnormalities are identified.
IMPRESSION: Negative.

## 2020-07-25 ENCOUNTER — Ambulatory Visit: Payer: Self-pay

## 2020-07-28 ENCOUNTER — Ambulatory Visit: Payer: Self-pay

## 2021-04-12 ENCOUNTER — Other Ambulatory Visit: Payer: Self-pay

## 2021-04-12 ENCOUNTER — Ambulatory Visit (LOCAL_COMMUNITY_HEALTH_CENTER): Payer: Medicaid Other | Admitting: Family Medicine

## 2021-04-12 ENCOUNTER — Encounter: Payer: Self-pay | Admitting: Family Medicine

## 2021-04-12 VITALS — BP 117/80 | Ht <= 58 in | Wt 203.2 lb

## 2021-04-12 DIAGNOSIS — Z309 Encounter for contraceptive management, unspecified: Secondary | ICD-10-CM

## 2021-04-12 DIAGNOSIS — Z30017 Encounter for initial prescription of implantable subdermal contraceptive: Secondary | ICD-10-CM | POA: Diagnosis not present

## 2021-04-12 DIAGNOSIS — Z3009 Encounter for other general counseling and advice on contraception: Secondary | ICD-10-CM

## 2021-04-12 DIAGNOSIS — F32A Depression, unspecified: Secondary | ICD-10-CM

## 2021-04-12 DIAGNOSIS — F419 Anxiety disorder, unspecified: Secondary | ICD-10-CM

## 2021-04-12 DIAGNOSIS — Z872 Personal history of diseases of the skin and subcutaneous tissue: Secondary | ICD-10-CM

## 2021-04-12 MED ORDER — ETONOGESTREL 68 MG ~~LOC~~ IMPL
68.0000 mg | DRUG_IMPLANT | Freq: Once | SUBCUTANEOUS | Status: AC
  Administered 2021-04-12: 68 mg via SUBCUTANEOUS

## 2021-04-12 NOTE — Progress Notes (Signed)
Here for Pap and BCM, needs PE. Last pap was in 02/2018, NIL.Marland KitchenMarland KitchenJenetta Downer, RN  ?

## 2021-04-12 NOTE — Progress Notes (Signed)
[facility] DEPARTMENT ?[facility] ?[address] [employer] ?Main Number: [phone] ? ? ? ?Family Planning Visit- Initial Visit ? ?Subjective:  ?Kristina Avery is a 31 y.o.  G2P0020   being seen today for an initial annual visit and to discuss reproductive life planning.  The patient is currently using No Method - Other Reason for pregnancy prevention. Patient reports   does not want a pregnancy in the next year.  Patient has the following medical conditions has First trimester bleeding on their problem list. ? ?Chief Complaint  ?Patient presents with  ? Annual Exam  ? Contraception  ? ? ?Patient reports she is here today for her annual exam and birth control.  She is interested in the Nexplanon.  States that she has used Depo in the with some side effect of "nausea".  Denies anaphylactic reactions with Depo-  Client was being worked up for PCOS- but could finish the testing when she lost her health insurance.  She is now on Medicaid aand will establish  PCP.  Client states that she has dystonia. ? ?Patient denies   ? ?Body mass index is 45.56 kg/m?. - Patient is eligible for diabetes screening based on BMI and age >40?  not applicable ?HA1C ordered? not applicable ? ?Patient reports 1  partner/s in last year. Desires STI screening?  Yes. ? ?Has patient been screened once for HCV in the past?  Yes ? No results found for: HCVAB ? ?Does the patient have current drug use (including MJ), have a partner with drug use, and/or has been incarcerated since last result? Yes  ?If yes-- Screen for HCV through [facility] ?  ?Does the patient meet criteria for HBV testing? Yes  declines testing. ? ?Criteria:  ?-Household, sexual or needle sharing contact with HBV ?-History of drug use ?-HIV positive ?-Those with known Hep C ? ? ?Health Maintenance Due  ?Topic Date Due  ? COVID-19 Vaccine (1) Never done  ? HIV Screening  Never done  ? Hepatitis C Screening  Never done  ? INFLUENZA VACCINE  Never  done  ? TETANUS/TDAP  09/19/2020  ? PAP-Cervical Cytology Screening  02/14/2021  ? PAP SMEAR-Modifier  02/14/2021  ? ? ?Review of Systems  ?Gastrointestinal:  Positive for heartburn.  ?Genitourinary:  Positive for frequency and urgency.  ?     Frequency increases with the intake of fluids  ?Musculoskeletal:  Positive for joint pain.  ?     Ongoing issues with pain in knees, wrists, ankles- from the dystonia  ?Skin:  Positive for rash.  ?     Hj/o hidradenitis supputativa  ?Neurological:  Positive for dizziness and headaches.  ?     Has migraines with dizziness  ? ?The following portions of the patient's history were reviewed and updated as appropriate: allergies, current medications, past family history, past medical history, past social history, past surgical history and problem list. Problem list updated. ? ? ?See flowsheet for other program required questions. ? ?Objective:  ? ?Vitals:  ? 04/12/21 1122  ?BP: 117/80  ?Weight: 203 lb 3.2 oz (92.2 kg)  ?Height: 4\' 8"  (1.422 m)  ? ? ?Physical Exam ?Constitutional:   ?   Appearance: Normal appearance. She is obese.  ?HENT:  ?   Head: Normocephalic and atraumatic.  ?Pulmonary:  ?   Effort: Pulmonary effort is normal.  ?Abdominal:  ?   Palpations: Abdomen is soft.  ?Genitourinary: ?   Exam position: Lithotomy position.  ?  Pubic Area: No rash or pubic lice.   ?   Labia:     ?   Right: No rash, tenderness, lesion or injury.     ?   Left: No rash, tenderness, lesion or injury.   ?   Vagina: Normal.  ?   Cervix: Normal.  ?   Uterus: Normal.   ?   Adnexa: Right adnexa normal and left adnexa normal.  ?   Rectum: Normal.  ?Musculoskeletal:     ?   General: Normal range of motion.  ?Lymphadenopathy:  ?   Lower Body: No right inguinal adenopathy. No left inguinal adenopathy.  ?Skin: ?   General: Skin is warm and dry.  ?Neurological:  ?   General: No focal deficit present.  ?   Mental Status: She is alert.  ?Psychiatric:     ?   Mood and Affect: Mood normal.     ?   Behavior:  Behavior normal.  ? ? ? ? ?Assessment and Plan:  ?Kristina Avery is a 31 y.o. female presenting to the [facility] Department for an initial annual wellness/contraceptive visit ? ?Contraception counseling: Reviewed all forms of birth control options in the tiered based approach. available including abstinence; over the counter/barrier methods; hormonal contraceptive medication including pill, patch, ring, injection,contraceptive implant, ECP; hormonal and nonhormonal IUDs; permanent sterilization options including vasectomy and the various tubal sterilization modalities. Risks, benefits, and typical effectiveness rates were reviewed.  Questions were answered.  Written information was also given to the patient to review.  Patient desires IUD or IUS, this was prescribed for patient.  ? ? ?The patient will follow up in  PRN or 1 year for surveillance.  The patient was told to call with any further questions, or with any concerns about this method of contraception.  Emphasized use of condoms 100% of the time for STI prevention. ? ?Patient was not an ECP candidate- no sexual activity since Feb., 2023 ? ?1. Family planning ? ?Nexplanon Insertion Procedure ?Patient identified, informed consent performed, consent signed.   Patient does understand that irregular bleeding is a very common side effect of this medication. She was advised to have backup contraception after placement. Patient was determined to meet WHO criteria for not being pregnant. Appropriate time out taken.  The insertion site was identified 8-10 cm (3-4 inches) from the medial epicondyle of the humerus and 3-5 cm (1.25-2 inches) posterior to (below) the sulcus (groove) between the biceps and triceps muscles of the patient's LEFT arm and marked.  Patient was prepped with alcohol swab and then injected with 3 ml of 1% lidocaine.  Arm was prepped with chlorhexidene, Nexplanon removed from packaging,  Device confirmed in needle, then inserted full  length of needle and withdrawn per handbook instructions. Nexplanon was able to palpated in the patient's arm; patient palpated the insert herself. There was minimal blood loss.  Patient insertion site covered with guaze and a pressure bandage to reduce any bruising.  The patient tolerated the procedure well and was given post procedure instructions.  ? ?- Chlamydia/Gonorrhea [facility] ?- IGP, Aptima HPV ? ?2. Nexplanon insertion ?- etonogestrel (NEXPLANON) implant 68 mg ? ?3. Hx of hidradenitis suppurativa ? ? ?Return if symptoms worsen or fail to improve. ? ?No future appointments. ? ?Larene Pickett, FNP ? ?

## 2021-04-12 NOTE — Progress Notes (Signed)
Client was instructed to notify clinic if noted any signs of complication with the Nexplanon, ie. Redness, swelling, itching, rash or SOB/throat swelling.   Explained that nausea isn't considered a allergic reaction.  She states she understands the instructions. ?

## 2021-04-12 NOTE — Progress Notes (Signed)
Refer to Kathreen Cosier for counseling. ?

## 2021-04-12 NOTE — Addendum Note (Signed)
Addended by: Larene Pickett on: 04/12/2021 01:39 PM ? ? Modules accepted: Orders ? ?

## 2021-04-15 LAB — IGP, APTIMA HPV
HPV Aptima: NEGATIVE
PAP Smear Comment: 0

## 2021-07-24 NOTE — Progress Notes (Unsigned)
PAP Normal and HPV Negative.  Repeat PAP in 5 years (04-2026) per Lyndel Safe, MD.  PAP card mailed today.  Routed 07-24-2021.  Hart Carwin, RN

## 2021-07-24 NOTE — Progress Notes (Unsigned)
Late Entry pap smear triage  NIL, HPV negative  Next pap in 5 years 

## 2021-10-15 ENCOUNTER — Ambulatory Visit: Payer: Self-pay

## 2021-10-25 ENCOUNTER — Ambulatory Visit: Payer: Medicaid Other | Admitting: Licensed Clinical Social Worker

## 2021-10-25 DIAGNOSIS — F422 Mixed obsessional thoughts and acts: Secondary | ICD-10-CM

## 2021-10-25 DIAGNOSIS — F331 Major depressive disorder, recurrent, moderate: Secondary | ICD-10-CM | POA: Insufficient documentation

## 2021-10-25 NOTE — Progress Notes (Signed)
Counselor Initial Adult Exam  Name: Kristina Avery Date: 10/25/2021 MRN: [id number] DOB: 03-Jul-1990 PCP: Pcp, No  Time spent: 70 minutes  A biopsychosocial was completed on the Patient. Background information and current concerns were obtained during an intake in the office with the [facility] Department clinician, Kathreen Cosier, LCSW.  Reviewed profession disclosure, contact information and confidentiality was discussed and appropriate consents were signed.  Patient gave verbal consent for Paulina Fusi, MSW, [facility] intern to be present in session.   Reason for Visit /Presenting Problem: Patient presents due to a history of being told by various people and her family that she should "talk to someone" since she was young. She reports that a lot has happened since January 2023 and she has finally decided to reach out. Patient reports that she lost her dad February 09, 2021 and she also lost her pet chinchilla in the last month. She shares she has a lot of guilt around not spending more time with her dad and with her chinchilla. Patient shares that she is scared to drive and has a difficult time leaving the house, she reports that this began about 1-1.5 years ago, she doesn't like to go out of the house because she is afraid that she might hurt the person riding with her or someone else, like cause an accident or something, or she may hurt or run over an animal- she reports that this worsened after she accidentally hit a cat and killed it. She reports that she has obsessive thoughts about picking up trash and feels like all things have feelings and doesn't want to leave things alone and also has a hard time throwing things away. She often will turn around to go get trash or to go check to be sure she did not run into any animals. She can't get these thoughts to go away and they cause significant distress and the only way to help is to lean into the checking or picking up the trash. She also reports  doing things by 2's and needing to touch things until they feel a certain way to her. She reports having these types of obsessions and compulsions since childhood.  She reports that the obsessions and compulsions cause significant distress and interfere with the her social or role functioning. Patient scores positive (26 which = sever range) on the YALE-BROWN OBSESSIVE COMPULSIVE SCALE (Y-BOCS). Patient also endorsees symptoms of depressed mood which is exacerbated by the resent loses she has experienced and the difficulties caused by OCD symptoms.   Patient reports that she is having difficulties being able to work and has fallen behind on her bills. She reports being in a supportive relationship with her boyfriend of 3.5 years. She also reports supportive friends and famiyl members. Patient describes an overall good childhood and denies any childhood trauma. She reports that she was raised by both parents although her parents divorced when she was about 8yo.  Patient further reports some back problems and having HS (skin disorder).       10/25/2021   11:08 AM 04/12/2021    1:04 PM  Depression screen PHQ 2/9  Decreased Interest 3 2  Down, Depressed, Hopeless 2 3  PHQ - 2 Score 5 5  Altered sleeping 2 2  Tired, decreased energy 2 3  Change in appetite 3 2  Feeling bad or failure about yourself  3 3  Trouble concentrating 3 2  Moving slowly or fidgety/restless 1 1  Suicidal thoughts 0 0  PHQ-9 Score 19 18  Difficult doing work/chores  Very difficult   Mental Status Exam:    Appearance:   Casual and Neat     Behavior:  Appropriate and Sharing  Motor:  Normal  Speech/Language:   Clear and Coherent and Normal Rate  Affect:  Appropriate, Congruent, Full Range, and Tearful  Mood:  normal  Thought process:  normal  Thought content:    WNL  Sensory/Perceptual disturbances:    WNL  Orientation:  oriented to person, place, time/date, and situation  Attention:  Good  Concentration:  Good   Memory:  WNL  Fund of knowledge:   Good  Insight:    Good  Judgment:   Fair  Impulse Control:  Fair   Reported Symptoms:  Obsessive thinking, Anhedonia, Sleep disturbance, Appetite disturbance, Isolation and withdrawal, Fatigue, Physical aches and pain, and compulsions, anxious thoughts  Risk Assessment: Danger to Self:  No Self-injurious Behavior: No Danger to Others: No Duty to Warn:no Physical Aggression / Violence:No  Access to Firearms a concern: No  Gang Involvement:No  Patient / guardian was educated about steps to take if suicide or homicide risk level increases between visits: yes While future psychiatric events cannot be accurately predicted, the patient does not currently require acute inpatient psychiatric care and does not currently meet [facility] involuntary commitment criteria.  Substance Abuse History: Current substance abuse: Yes   Marijuana use daily.    Past Psychiatric History:   No previous psychological problems have been observed No family history of mental health diagnosis  Outpatient Providers: NA History of Psych Hospitalization: No   Abuse History: Victim of No.,  Report needed: No. Victim of Neglect:No. Perpetrator of  NA   Witness / Exposure to Domestic Violence: No   Protective Services Involvement: No  Witness to [employer] Violence:  No   Family History:  Family History  Problem Relation Age of Onset   Diabetes Paternal Grandfather    COPD Paternal Grandfather    Diabetes Paternal Grandmother    Diabetes Maternal Grandmother    COPD Maternal Grandmother    Diabetes Maternal Grandfather    Diabetes Father    Cirrhosis Father    Diabetes Brother    Kidney failure Brother    Social History:  Social History   Socioeconomic History   Marital status: Single    Spouse name: Not on file   Number of children: 0   Years of education: 12   Highest education level: High school graduate  Occupational History   Not on file  Tobacco  Use   Smoking status: Never    Passive exposure: Never   Smokeless tobacco: Never  Vaping Use   Vaping Use: Never used  Substance and Sexual Activity   Alcohol use: Yes    Comment: Occ   Drug use: Yes    Types: Marijuana    Comment: Daily, only thing that helps with the pain   Sexual activity: Yes    Birth control/protection: None  Other Topics Concern   Not on file  Social History Narrative   Not on file   Social Determinants of Health   Financial Resource Strain: Not on file  Food Insecurity: Not on file  Transportation Needs: Not on file  Physical Activity: Not on file  Stress: Not on file  Social Connections: Not on file   Living situation: the patient lives alone.   Sexual Orientation:  Pansexual  Relationship Status: boyfriend   Name of spouse / other: NA  If a parent, number of children / ages:lost two children- reports she is okay with that now.   Support Systems; Family, boyfriend, friends  Museum/gallery curator Stress:  Yes   Income/Employment/Disability: Occasional side work   Armed forces logistics/support/administrative officer: No   Educational History: Education: high school diploma/GED  Religion/Sprituality/World View:    Not currently   Any cultural differences that may affect / interfere with treatment:  not applicable   Recreation/Hobbies: read, video gamer   Stressors:Health problems   Other: Mental health     Strengths:  Supportive Relationships, Family, and Friends  Barriers:  Patient has difficulties driving due to her mental health symptoms, this may impact her getting to appointments    Legal History: Pending legal issue / charges: The patient has no significant history of legal issues. History of legal issue / charges:  No   Medical History/Surgical History:reviewed Past Medical History:  Diagnosis Date   Mental disorder    Pre-diabetes     No past surgical history on file.  Medications: Current Outpatient Medications  Medication Sig Dispense Refill    acetaminophen (TYLENOL) 500 MG tablet Take 500-1,000 mg by mouth every 6 (six) hours as needed for mild pain or moderate pain. (Patient not taking: Reported on 04/12/2021)     No current facility-administered medications for this visit.    Allergies  Allergen Reactions   Haldol [Haloperidol] Other (See Comments)    Akathisia   Depo-Provera [Medroxyprogesterone Acetate] Other (See Comments)    "got really sick, my eyes turned yellow".   BRENLYN BESHARA is a 30 y.o. year old female with no reported history of mental health diagnosis. Patient currently presents with depressed mood, obsessive thoughts, and compulsion behaviors. Patient reports these symptoms have been chronic, but have worsened significantly over the past 1-1.5 years. Patient currently describes significant obsessive Compulsive Disorder symptoms, that cause significant distress and interfere with the her social and role functioning. Patient scores positive (26 which = sever range) on the YALE-BROWN OBSESSIVE COMPULSIVE SCALE (Y-BOCS). Patient also endorsees symptoms of depressed mood which are exacerbated by the resent losses she has experienced and the difficulties caused by OCD symptoms. PHQ-9 = 19. Patient reports that these symptoms significantly impact her functioning in multiple life domains.   Due to the above symptoms and patient's reported history, patient is diagnosed with Obsessive Compulsive Disorder and Major Depressive Disorder, recurrent episode, Moderate. Continued mental health treatment is needed to address patient's symptoms and monitor her safety and stability. Patient is recommended for psychiatric medication management evaluation and continued outpatient therapy to further reduce her symptoms and improve her coping strategies.    There is no acute risk for suicide or violence at this time.  While future psychiatric events cannot be accurately predicted, the patient does not require acute inpatient psychiatric care  and does not currently meet [facility] involuntary commitment criteria.  Diagnoses:    ICD-10-CM   1. 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 Major depressive disorder, recurrent episode, moderate ([place])  F33.1      Plan of Care:  Patient's goal of treatment is to be able to drive again and work again    -LCSW provided brief psychoeducation and a rational for use of CBT's.  -LCSW and patient agreed to develop a treatment plan at next session.     Future Appointments  Date Time Provider [place]  11/01/2021  1:00 PM Milton Ferguson, LCSW [facility] None  Kathreen CosierAmanda Dagon Budai, LCSW

## 2021-11-01 ENCOUNTER — Ambulatory Visit: Payer: Medicaid Other | Admitting: Licensed Clinical Social Worker

## 2021-11-01 DIAGNOSIS — F422 Mixed obsessional thoughts and acts: Secondary | ICD-10-CM

## 2021-11-01 DIAGNOSIS — F331 Major depressive disorder, recurrent, moderate: Secondary | ICD-10-CM

## 2021-11-01 NOTE — Progress Notes (Signed)
Counselor/Therapist Progress Note  Patient ID: Kristina Avery, MRN: [id number],    Date: 11/01/2021  Time Spent: 47 minutes    Treatment Type: Psychotherapy  Reported Symptoms:  Anxiety, obsessions, compulsions, depressed mood   Mental Status Exam:  Appearance:   Casual     Behavior:  Appropriate and Sharing  Motor:  Normal  Speech/Language:   Clear and Coherent and Normal Rate  Affect:  Appropriate, Congruent, Full Range, and Tearful  Mood:  sad  Thought process:  normal  Thought content:    WNL  Sensory/Perceptual disturbances:    WNL  Orientation:  oriented to person, place, time/date, and situation  Attention:  Fair  Concentration:  Fair  Memory:  WNL  Fund of knowledge:   Good  Insight:    Fair  Judgment:   Fair  Impulse Control:  Fair   Risk Assessment: Danger to Self:  No Self-injurious Behavior: No Danger to Others: No Duty to Warn:no Physical Aggression / Violence:No  Access to Firearms a concern: No  Gang Involvement:No   Subjective: Patient was engaged and cooperative throughout the session using time effectively to discuss goal of treatment, treatment plan and thoughts and feelings. Patient voices continued motivation for treatment and understanding of CBTs and the importance of being evaluated for medication management.    Interventions: Cognitive Behavioral Therapy and Client Centered  Established psychological safety.  Checked in with patient and reviewed previous session, including assessment and goal of treatment. Provided psychoeducation on CBTs. Explored patient's goal of treatment and worked collaboratively to develop CBTs treatment plan. Provided support through active listening, validation of feelings, and highlighted patient's strengths.   Diagnosis:   ICD-10-CM   1. Major depressive disorder, recurrent episode, moderate (HCC)  F33.1     2. Mixed obsessional thoughts and acts  F42.2      Plan: Patient's goal of treatment is to be able to  drive again and work again.   Treatment Target: Understand the relationship between thoughts, emotions, and behaviors  Psychoeducation on CBT model   Oriented the client to the therapeutic approach Teach the connection between thoughts, emotions, and behaviors   Treatment Target: Increase realistic balanced thinking -to learn how to replace thinking with thoughts that are more accurate or helpful Explore patient's thoughts, beliefs, automatic thoughts, assumptions  Identify and replace unhelpful thinking patterns (upsetting ideas, self-talk and mental images) Process distress and allow for emotional release  Questioning and challenging thoughts Cognitive reappraisal  Restructuring, Socratic questioning   Treatment Target: Increase psychological flexibility (Acceptance, defusion, observer self/perspective-taking, present moment awareness, values and committed action) Teach mindfulness skills  Psychoeducation about cognitive fusion- Hand as thoughts metaphor  Increase awareness of control vs. acceptance Reduce avoidance of certain emotions or situations Discuss "unhooking" and emphasize that thoughts and feelings do not always lead to action Increasing self-knowledge  Getting in touch with the observant self Values-guided behavioral change  Treatment Target: Decrease obsessions and compulsion behaviors  Fear Hierarchy  Identify Behavioral Experiments - beginning with small steps - patient's choice  imagined exposure Role Plays  Homework experiments chosen by patient    Future Appointments  Date Time Provider [place]  11/08/2021  2:00 PM Milton Ferguson, LCSW [facility] None    Milton Ferguson, LCSW

## 2021-11-08 ENCOUNTER — Ambulatory Visit: Payer: Medicaid Other | Admitting: Licensed Clinical Social Worker

## 2021-11-08 DIAGNOSIS — Z87891 Personal history of nicotine dependence: Secondary | ICD-10-CM | POA: Diagnosis not present

## 2021-11-08 DIAGNOSIS — G8929 Other chronic pain: Secondary | ICD-10-CM | POA: Diagnosis not present

## 2021-11-08 DIAGNOSIS — M546 Pain in thoracic spine: Secondary | ICD-10-CM | POA: Diagnosis not present

## 2021-11-08 DIAGNOSIS — M545 Low back pain, unspecified: Secondary | ICD-10-CM | POA: Diagnosis not present

## 2021-11-08 DIAGNOSIS — R0602 Shortness of breath: Secondary | ICD-10-CM | POA: Diagnosis not present

## 2021-11-13 ENCOUNTER — Telehealth: Payer: Self-pay | Admitting: Licensed Clinical Social Worker

## 2021-11-13 NOTE — Telephone Encounter (Signed)
11/09/21- pt. Lvm requesting to reschedule missed appt. LCSw attempted to return call and lvm for pt.

## 2021-11-27 ENCOUNTER — Telehealth: Payer: Self-pay | Admitting: Licensed Clinical Social Worker

## 2021-11-27 DIAGNOSIS — M79604 Pain in right leg: Secondary | ICD-10-CM | POA: Diagnosis not present

## 2021-11-27 DIAGNOSIS — Z87891 Personal history of nicotine dependence: Secondary | ICD-10-CM | POA: Diagnosis not present

## 2021-11-27 DIAGNOSIS — M545 Low back pain, unspecified: Secondary | ICD-10-CM | POA: Diagnosis not present

## 2021-11-27 DIAGNOSIS — M546 Pain in thoracic spine: Secondary | ICD-10-CM | POA: Diagnosis not present

## 2021-11-27 DIAGNOSIS — G8929 Other chronic pain: Secondary | ICD-10-CM | POA: Diagnosis not present

## 2021-11-27 DIAGNOSIS — M5136 Other intervertebral disc degeneration, lumbar region: Secondary | ICD-10-CM | POA: Diagnosis not present

## 2021-11-27 DIAGNOSIS — G249 Dystonia, unspecified: Secondary | ICD-10-CM | POA: Diagnosis not present

## 2021-11-27 DIAGNOSIS — M79605 Pain in left leg: Secondary | ICD-10-CM | POA: Diagnosis not present

## 2021-11-27 NOTE — Telephone Encounter (Signed)
LCSW attempted to call patient to follow up regarding clerical message from patient that she would like to schedule an appointment. LCSW lvm for patient.

## 2024-02-04 ENCOUNTER — Ambulatory Visit

## 2024-02-04 ENCOUNTER — Ambulatory Visit: Payer: Self-pay | Admitting: Physician Assistant

## 2024-02-04 ENCOUNTER — Ambulatory Visit
Admission: EM | Admit: 2024-02-04 | Discharge: 2024-02-04 | Disposition: A | Discharge status: Home / Self Care | Attending: Physician Assistant | Admitting: Physician Assistant

## 2024-02-04 DIAGNOSIS — K219 Gastro-esophageal reflux disease without esophagitis: Secondary | ICD-10-CM

## 2024-02-04 DIAGNOSIS — F410 Panic disorder [episodic paroxysmal anxiety] without agoraphobia: Secondary | ICD-10-CM | POA: Diagnosis not present

## 2024-02-04 DIAGNOSIS — L732 Hidradenitis suppurativa: Secondary | ICD-10-CM

## 2024-02-04 DIAGNOSIS — R0602 Shortness of breath: Secondary | ICD-10-CM | POA: Diagnosis not present

## 2024-02-04 DIAGNOSIS — J40 Bronchitis, not specified as acute or chronic: Secondary | ICD-10-CM | POA: Diagnosis not present

## 2024-02-04 DIAGNOSIS — R053 Chronic cough: Secondary | ICD-10-CM | POA: Diagnosis not present

## 2024-02-04 DIAGNOSIS — G8929 Other chronic pain: Secondary | ICD-10-CM | POA: Diagnosis not present

## 2024-02-04 DIAGNOSIS — F32A Depression, unspecified: Secondary | ICD-10-CM | POA: Diagnosis not present

## 2024-02-04 DIAGNOSIS — R918 Other nonspecific abnormal finding of lung field: Secondary | ICD-10-CM | POA: Diagnosis not present

## 2024-02-04 DIAGNOSIS — N926 Irregular menstruation, unspecified: Secondary | ICD-10-CM

## 2024-02-04 MED ORDER — PROMETHAZINE-DM 6.25-15 MG/5ML PO SYRP: 5.0000 mL | ORAL_SOLUTION | Freq: Four times a day (QID) | ORAL | 0 refills | Status: AC | PRN

## 2024-02-04 MED ORDER — DOXYCYCLINE HYCLATE 100 MG PO CAPS: 100.0000 mg | ORAL_CAPSULE | Freq: Two times a day (BID) | ORAL | 0 refills | Status: AC

## 2024-02-04 MED ORDER — CHLORHEXIDINE GLUCONATE 4 % EX SOLN: Freq: Every day | CUTANEOUS | 2 refills | Status: AC | PRN

## 2024-02-04 MED ORDER — ALBUTEROL SULFATE HFA 108 (90 BASE) MCG/ACT IN AERS: 1.0000 | INHALATION_SPRAY | Freq: Four times a day (QID) | RESPIRATORY_TRACT | 2 refills | Status: AC | PRN

## 2024-02-04 MED ORDER — HYDROXYZINE HCL 25 MG PO TABS: 25.0000 mg | ORAL_TABLET | Freq: Four times a day (QID) | ORAL | 1 refills | Status: AC | PRN

## 2024-02-04 MED ORDER — NAPROXEN 500 MG PO TABS: 500.0000 mg | ORAL_TABLET | Freq: Two times a day (BID) | ORAL | 1 refills | Status: AC | PRN

## 2024-02-04 MED ORDER — PANTOPRAZOLE SODIUM 40 MG PO TBEC: 40.0000 mg | DELAYED_RELEASE_TABLET | Freq: Every day | ORAL | 2 refills | Status: AC

## 2024-02-04 MED ORDER — ALBUTEROL SULFATE (2.5 MG/3ML) 0.083% IN NEBU
2.5000 mg | INHALATION_SOLUTION | Freq: Once | RESPIRATORY_TRACT | Status: AC
  Administered 2024-02-04: 2.5 mg via RESPIRATORY_TRACT

## 2024-02-04 NOTE — ED Triage Notes (Signed)
 Patient states that she's been having her sx for months  SOB

## 2024-02-04 NOTE — ED Provider Notes (Signed)
 " [facility]    CSN: [id number] Arrival date & time: 02/04/24  1519      History   Chief Complaint Chief Complaint  Patient presents with   Shortness of Breath    HPI Kristina Avery is a 33 y.o. female presenting with her mother for multiple chronic complaints.   Patient reports severe acid reflux. Not currently taking meds for GERD. States she was advised to have endoscopy but never did.   Has a chronic cough and SOB. Has had brown, foamy or clear/yellowish sputum.  Denies history of asthma. Former smoker. Stopped smoking 1 month ago. Smokes marijuana for pain relief. Occasionally uses vape.   Reports chronic back pain, hip pain, and knee pain. She says, I am always in pain. Does not work a regular job because of pain. She does [employer]. States she keeps getting let go from work because she calls in for pain. NSAIDs occasionally help.   History of irregular menstrual periods. History of miscarriages and infertility. Patient has been semi-worked up for this. States they were going to check her for PCOS and endometriosis but she lost her insurance. Has Medicaid now but not established with a PCP.  She says she has made appointments but they get canceled without a reason.  She does not have a physical Medicaid card.  Mother says she cries all the time. Patient says, My therapist bailed on me. 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 Patient says she picks up receipts and feels like she cannot throw anything away. Reports panicking and hyperventilating if her trash gets thrown away.  History of panic attacks.  Patient says she has not been on any medications but has been diagnosed with depression and obsessive thoughts and acts.  Patient also reports concern for possible hidradenitis suppurativa.  There is a family history of this and she has numerous boils and bumps on her chest, abdomen, groin and under breasts.  States they come and go.  HPI  Past Medical History:   Diagnosis Date   Mental disorder    Pre-diabetes     Patient Active Problem List   Diagnosis Date Noted   Mixed obsessional thoughts and acts 10/25/2021   Major depressive disorder, recurrent episode, moderate (HCC) 10/25/2021   First trimester bleeding 02/17/2018    History reviewed. No pertinent surgical history.  OB History     Gravida  2   Para      Term      Preterm      AB  2   Living         SAB  2   IAB      Ectopic      Multiple      Live Births               Home Medications    Prior to Admission medications  Medication Sig Start Date End Date Taking? Authorizing Provider  albuterol  (VENTOLIN  HFA) 108 (90 Base) MCG/ACT inhaler Inhale 1-2 puffs into the lungs every 6 (six) hours as needed for wheezing or shortness of breath. 02/04/24  Yes Arvis Huxley B, PA-C  chlorhexidine (HIBICLENS) 4 % external liquid Apply topically daily as needed. 02/04/24  Yes Arvis Huxley B, PA-C  doxycycline (VIBRAMYCIN) 100 MG capsule Take 1 capsule (100 mg total) by mouth 2 (two) times daily for 10 days. 02/04/24 02/14/24 Yes Arvis Huxley NOVAK, PA-C  hydrOXYzine (ATARAX) 25 MG tablet Take 1 tablet (25 mg total) by  mouth every 6 (six) hours as needed for anxiety. 02/04/24  Yes Arvis Huxley B, PA-C  naproxen (NAPROSYN) 500 MG tablet Take 1 tablet (500 mg total) by mouth 2 (two) times daily as needed for moderate pain (pain score 4-6). 02/04/24  Yes Arvis Huxley B, PA-C  pantoprazole (PROTONIX) 40 MG tablet Take 1 tablet (40 mg total) by mouth daily. 02/04/24  Yes Arvis Huxley NOVAK, PA-C  promethazine-dextromethorphan (PROMETHAZINE-DM) 6.25-15 MG/5ML syrup Take 5 mLs by mouth 4 (four) times daily as needed for cough. 02/04/24  Yes Arvis Huxley B, PA-C  acetaminophen  (TYLENOL ) 500 MG tablet Take 500-1,000 mg by mouth every 6 (six) hours as needed for mild pain or moderate pain. Patient not taking: Reported on 04/12/2021    [provider]    Family History Family  History  Problem Relation Age of Onset   Diabetes Paternal Grandfather    COPD Paternal Grandfather    Diabetes Paternal Grandmother    Diabetes Maternal Grandmother    COPD Maternal Grandmother    Diabetes Maternal Grandfather    Diabetes Father    Cirrhosis Father    Diabetes Brother    Kidney failure Brother     Social History Social History[1]   Allergies   Haldol  [haloperidol ] and Depo-provera [medroxyprogesterone acetate]   Review of Systems Review of Systems  Constitutional:  Positive for appetite change. Negative for fatigue, fever and unexpected weight change.  HENT:  Positive for congestion.   Respiratory:  Positive for cough and shortness of breath.   Cardiovascular:  Positive for chest pain. Negative for palpitations.  Gastrointestinal:  Positive for nausea and vomiting. Negative for abdominal pain, constipation and diarrhea.  Genitourinary:  Positive for menstrual problem. Negative for dysuria.  Musculoskeletal:  Positive for arthralgias and back pain. Negative for gait problem and joint swelling.  Skin:  Positive for color change and rash.  Neurological:  Negative for dizziness, syncope, weakness and headaches.  Psychiatric/Behavioral:  Positive for dysphoric mood. Negative for suicidal ideas. The patient is nervous/anxious.      Physical Exam Triage Vital Signs ED Triage Vitals [02/04/24 1637]  Encounter Vitals Group     BP      Girls Systolic BP Percentile      Girls Diastolic BP Percentile      Boys Systolic BP Percentile      Boys Diastolic BP Percentile      Pulse      Resp      Temp      Temp src      SpO2      Weight 210 lb (95.3 kg)     Height      Head Circumference      Peak Flow      Pain Score      Pain Loc      Pain Education      Exclude from Growth Chart    No data found.  Updated Vital Signs BP 123/88 (BP Location: Right Arm)   Pulse 98   Temp 98.4 F (36.9 C) (Oral)   Resp 19   Wt 210 lb (95.3 kg)   SpO2 94%   BMI  47.08 kg/m     Physical Exam Vitals and nursing note reviewed.  Constitutional:      General: She is not in acute distress.    Appearance: Normal appearance. She is not ill-appearing or toxic-appearing.  HENT:     Head: Normocephalic and atraumatic.     Nose: Nose normal.  Mouth/Throat:     Mouth: Mucous membranes are moist.     Pharynx: Oropharynx is clear.  Eyes:     General: No scleral icterus.       Right eye: No discharge.        Left eye: No discharge.     Conjunctiva/sclera: Conjunctivae normal.  Cardiovascular:     Rate and Rhythm: Normal rate and regular rhythm.     Heart sounds: Normal heart sounds.  Pulmonary:     Effort: Pulmonary effort is normal. No respiratory distress.     Breath sounds: Wheezing (throughout left lung) present.  Abdominal:     Palpations: Abdomen is soft.     Tenderness: There is no abdominal tenderness.  Musculoskeletal:     Cervical back: Neck supple.  Skin:    General: Skin is dry.     Findings: Lesion and rash present.     Comments: Numerous scattered erythematous-purplish papules, cystic lesions under breasts, axillary regions and under abdominal fold  Neurological:     General: No focal deficit present.     Mental Status: She is alert. Mental status is at baseline.     Motor: No weakness.     Gait: Gait normal.  Psychiatric:        Mood and Affect: Mood is anxious.     Comments: Tearful at times      UC Treatments / Results  Labs (all labs ordered are listed, but only abnormal results are displayed) Labs Reviewed - No data to display  EKG   Radiology DG Chest 2 View Result Date: 02/04/2024 CLINICAL DATA:  Chronic cough and shortness of breath. EXAM: CHEST - 2 VIEW COMPARISON:  None Available. FINDINGS: The cardiomediastinal contours are normal. Trace bronchial thickening. Pulmonary vasculature is normal. No consolidation, pleural effusion, or pneumothorax. No acute osseous abnormalities are seen. IMPRESSION: Trace  bronchial thickening. Electronically Signed   By: Andrea Gasman M.D.   On: 02/04/2024 17:58    Procedures Procedures (including critical care time)  Medications Ordered in UC Medications  albuterol  (PROVENTIL ) (2.5 MG/3ML) 0.083% nebulizer solution 2.5 mg (2.5 mg Nebulization Given 02/04/24 1721)    Initial Impression / Assessment and Plan / UC Course  I have reviewed the triage vital signs and the nursing notes.  Pertinent labs & imaging results that were available during my care of the patient were reviewed by me and considered in my medical decision making (see chart for details).   33 year old female presents with mother for multiple chronic complaints.  Patient has Medicaid but is not established with a primary care provider and says she does not know where to start. Does not have a Medicaid card. She was given a printout with her member ID and a phone number to contact to establish with PCP.  1.  GERD/Chronic cough: Will begin patient on pantoprazole.  May also use Pepto-Bismol.  Counseled on dietary changes.  Advised smaller meals, plenty of rest and fluids, avoiding triggers.  Advised to follow-up with PCP regarding this.  Likely needs referral to GI for upper endoscopy.  2.  Shortness of breath/chest pain/bronchitis: Vitals are all stable and normal.  She is in no acute respiratory distress.  She does have scattered wheezes throughout the left lung.    Chest x-ray performed. Peribronchial thickening.   Patient given albuterol  nebulizer. Will have her begin albuterol  inhaler as needed. Considered prednisone but I do not want to worsen anxiety.  3.  Panic attacks/depression/anxiety/obsessive thoughts: Patient was advised to  follow-up with new primary care provider regarding this.  Explained she will likely be referred to psych and a therapist.  We can try hydroxyzine at this time.  Encouraged her to lean on family and friends for support.  Advised giving to ED for any acute  worsening of condition especially if she is having suicidal ideation.  4.  Rash/hidradenitis suppurativa: Rash on chest, abdomen, axillary region consistent with hidradenitis suppurativa.  There is a family history.  Will treat at this time with doxycycline.  Also advised good hygiene, cleansing with Hibaclens. Advised to follow-up with primary care provider regarding this.  May need referral to dermatology.  5.  Irregular menstrual periods: Patient advised to follow-up with primary care provider regarding long history of irregular menstrual periods.  Reviewed previous records.  Patient has history of infertility.  She has had some workup including Pap smear.  Advised to follow-up with OB/GYN for further evaluation but may need a referral for this by primary care provider since she has Medicaid.  6.  Chronic pain: Sent naproxen.  She says it has helped her in the past.  Advised daily stretching, massage, heat, ice.  This is another condition and she should follow-up with primary care regarding.  May need further workup to assess for neuromuscular disease, fibromyalgia.  Could also be related to her underlying mental health condition.   Final Clinical Impressions(s) / UC Diagnoses   Final diagnoses:  Chronic cough  Gastroesophageal reflux disease, unspecified whether esophagitis present  Shortness of breath  Panic attacks  Depression, unspecified depression type  Hidradenitis suppurativa  Menstrual periods irregular  Other chronic pain  Bronchitis     Discharge Instructions      1.  GERD/Chronic cough: Start on pantoprazole.  May also use Pepto-Bismol.  Avoid triggers.  Advised smaller meals, plenty of rest and fluids.  Advised to follow-up with PCP regarding this.  Likely needs referral to GI for upper endoscopy.  2.  Shortness of breath/chest pain: Vitals are all stable and normal.  You have scattered wheezes throughout the left lung.    Chest x-ray performed--normal. Ill call if  radiologist sees something I missed.  Use albuterol  for shortness of breath and wheezing. Try promethazine DM if needed for cough.  3.  Panic attacks/depression/anxiety/obsessive thoughts: Advised to follow-up with new primary care provider regarding this.  You will likely be referred to psych and a therapist.  We can try hydroxyzine at this time.  Lean on family and friends for support.  Go to ED for any acute worsening of condition especially if you are having suicidal ideation.  4.  Rash/hidradenitis suppurativa: Rash on chest, abdomen, axillary region consistent with hidradenitis suppurativa.  There is a family history.  Will treat at this time with doxycycline.  Clean with Hibaclens daily. Advised to follow-up with primary care provider regarding this.  May need referral to dermatology.  5.  Irregular menstrual periods: Follow-up with primary care provider regarding long history of irregular menstrual periods. Advised to follow-up with OB/GYN for further evaluation but may need a referral for this by primary care provider since she has Medicaid. Go to ER if heavy bleeding associated with weakness.  6.  Chronic pain: Sent naproxen. Advised daily stretching, massage, heat, ice.  This is another condition you should follow-up with primary care regarding.  May need further workup to assess for neuromuscular disease, fibromyalgia.  Could also be related to her underlying mental health condition.     ED Prescriptions  Medication Sig Dispense Auth. Provider   chlorhexidine (HIBICLENS) 4 % external liquid Apply topically daily as needed. 532 mL Arvis Huxley B, PA-C   doxycycline (VIBRAMYCIN) 100 MG capsule Take 1 capsule (100 mg total) by mouth 2 (two) times daily for 10 days. 20 capsule Arvis Huxley B, PA-C   hydrOXYzine (ATARAX) 25 MG tablet Take 1 tablet (25 mg total) by mouth every 6 (six) hours as needed for anxiety. 30 tablet Arvis Huxley B, PA-C   pantoprazole (PROTONIX) 40 MG tablet  Take 1 tablet (40 mg total) by mouth daily. 30 tablet Arvis Huxley B, PA-C   albuterol  (VENTOLIN  HFA) 108 (90 Base) MCG/ACT inhaler Inhale 1-2 puffs into the lungs every 6 (six) hours as needed for wheezing or shortness of breath. 1 each Arvis Huxley NOVAK, PA-C   promethazine-dextromethorphan (PROMETHAZINE-DM) 6.25-15 MG/5ML syrup Take 5 mLs by mouth 4 (four) times daily as needed for cough. 118 mL Arvis Huxley B, PA-C   naproxen (NAPROSYN) 500 MG tablet Take 1 tablet (500 mg total) by mouth 2 (two) times daily as needed for moderate pain (pain score 4-6). 30 tablet Aydien Majette B, PA-C      PDMP not reviewed this encounter.     [1]  Social History Tobacco Use   Smoking status: Never    Passive exposure: Never   Smokeless tobacco: Never  Vaping Use   Vaping status: Never Used  Substance Use Topics   Alcohol use: Yes    Comment: Occ   Drug use: Yes    Types: Marijuana    Comment: Daily, only thing that helps with the pain     Arvis Huxley NOVAK DEVONNA 02/04/24 1917  "

## 2024-02-04 NOTE — Discharge Instructions (Addendum)
 1.  GERD/Chronic cough: Start on pantoprazole.  May also use Pepto-Bismol.  Avoid triggers.  Advised smaller meals, plenty of rest and fluids.  Advised to follow-up with PCP regarding this.  Likely needs referral to GI for upper endoscopy.  2.  Shortness of breath/chest pain: Vitals are all stable and normal.  You have scattered wheezes throughout the left lung.    Chest x-ray performed--normal. Ill call if radiologist sees something I missed.  Use albuterol  for shortness of breath and wheezing. Try promethazine DM if needed for cough.  3.  Panic attacks/depression/anxiety/obsessive thoughts: Advised to follow-up with new primary care provider regarding this.  You will likely be referred to psych and a therapist.  We can try hydroxyzine at this time.  Lean on family and friends for support.  Go to ED for any acute worsening of condition especially if you are having suicidal ideation.  4.  Rash/hidradenitis suppurativa: Rash on chest, abdomen, axillary region consistent with hidradenitis suppurativa.  There is a family history.  Will treat at this time with doxycycline.  Clean with Hibaclens daily. Advised to follow-up with primary care provider regarding this.  May need referral to dermatology.  5.  Irregular menstrual periods: Follow-up with primary care provider regarding long history of irregular menstrual periods. Advised to follow-up with OB/GYN for further evaluation but may need a referral for this by primary care provider since she has Medicaid. Go to ER if heavy bleeding associated with weakness.  6.  Chronic pain: Sent naproxen. Advised daily stretching, massage, heat, ice.  This is another condition you should follow-up with primary care regarding.  May need further workup to assess for neuromuscular disease, fibromyalgia.  Could also be related to her underlying mental health condition.
